# Patient Record
Sex: Female | Born: 1950 | Race: White | Hispanic: No | Smoking: Never smoker
Health system: Southern US, Community
[De-identification: ages and names within clinical notes are randomized; demographics above are authoritative.]

---

## 1998-12-03 ENCOUNTER — Other Ambulatory Visit: Admission: RE | Admit: 1998-12-03 | Discharge: 1998-12-03 | Payer: Self-pay | Admitting: Obstetrics and Gynecology

## 1999-02-05 ENCOUNTER — Other Ambulatory Visit: Admission: RE | Admit: 1999-02-05 | Discharge: 1999-02-05 | Payer: Self-pay | Admitting: Obstetrics & Gynecology

## 1999-05-11 ENCOUNTER — Other Ambulatory Visit: Admission: RE | Admit: 1999-05-11 | Discharge: 1999-05-11 | Payer: Self-pay | Admitting: Obstetrics and Gynecology

## 1999-12-21 ENCOUNTER — Other Ambulatory Visit: Admission: RE | Admit: 1999-12-21 | Discharge: 1999-12-21 | Payer: Self-pay | Admitting: *Deleted

## 2000-08-18 ENCOUNTER — Other Ambulatory Visit: Admission: RE | Admit: 2000-08-18 | Discharge: 2000-08-18 | Payer: Self-pay | Admitting: *Deleted

## 2001-01-30 ENCOUNTER — Other Ambulatory Visit: Admission: RE | Admit: 2001-01-30 | Discharge: 2001-01-30 | Payer: Self-pay | Admitting: *Deleted

## 2002-02-12 ENCOUNTER — Other Ambulatory Visit: Admission: RE | Admit: 2002-02-12 | Discharge: 2002-02-12 | Payer: Self-pay | Admitting: Obstetrics and Gynecology

## 2003-03-11 ENCOUNTER — Other Ambulatory Visit: Admission: RE | Admit: 2003-03-11 | Discharge: 2003-03-11 | Payer: Self-pay | Admitting: Obstetrics and Gynecology

## 2004-03-16 ENCOUNTER — Other Ambulatory Visit: Admission: RE | Admit: 2004-03-16 | Discharge: 2004-03-16 | Payer: Self-pay | Admitting: Obstetrics and Gynecology

## 2008-08-01 ENCOUNTER — Ambulatory Visit: Payer: Self-pay | Admitting: Sports Medicine

## 2008-08-01 DIAGNOSIS — M1612 Unilateral primary osteoarthritis, left hip: Secondary | ICD-10-CM | POA: Insufficient documentation

## 2008-12-11 ENCOUNTER — Encounter: Admission: RE | Admit: 2008-12-11 | Discharge: 2008-12-11 | Payer: Self-pay | Admitting: *Deleted

## 2008-12-11 ENCOUNTER — Ambulatory Visit: Payer: Self-pay | Admitting: Sports Medicine

## 2008-12-11 DIAGNOSIS — M25559 Pain in unspecified hip: Secondary | ICD-10-CM | POA: Insufficient documentation

## 2009-02-26 ENCOUNTER — Ambulatory Visit: Payer: Self-pay | Admitting: Sports Medicine

## 2009-06-01 ENCOUNTER — Encounter: Admission: RE | Admit: 2009-06-01 | Discharge: 2009-07-02 | Payer: Self-pay | Admitting: Sports Medicine

## 2012-02-07 ENCOUNTER — Other Ambulatory Visit (HOSPITAL_COMMUNITY)
Admission: RE | Admit: 2012-02-07 | Discharge: 2012-02-07 | Disposition: A | Discharge status: Home / Self Care | Payer: BC Managed Care – PPO | Source: Ambulatory Visit | Attending: Internal Medicine | Admitting: Internal Medicine

## 2012-02-07 DIAGNOSIS — Z1159 Encounter for screening for other viral diseases: Secondary | ICD-10-CM | POA: Insufficient documentation

## 2012-02-07 DIAGNOSIS — Z01419 Encounter for gynecological examination (general) (routine) without abnormal findings: Secondary | ICD-10-CM | POA: Insufficient documentation

## 2012-09-21 ENCOUNTER — Ambulatory Visit (INDEPENDENT_AMBULATORY_CARE_PROVIDER_SITE_OTHER): Payer: BC Managed Care – PPO

## 2012-09-21 DIAGNOSIS — Z23 Encounter for immunization: Secondary | ICD-10-CM

## 2013-09-11 ENCOUNTER — Ambulatory Visit (INDEPENDENT_AMBULATORY_CARE_PROVIDER_SITE_OTHER): Payer: BC Managed Care – PPO | Admitting: Family Medicine

## 2013-09-11 VITALS — BP 98/62 | HR 56 | Temp 98.0°F | Resp 18

## 2013-09-11 DIAGNOSIS — Z23 Encounter for immunization: Secondary | ICD-10-CM

## 2013-09-11 NOTE — Progress Notes (Signed)
  Subjective:    Patient ID: Caitlin Hood, female    DOB: [birth year], 62 y.o.   MRN: [id number]  HPI    Review of Systems     Objective:   Physical Exam        Assessment & Plan:

## 2014-09-17 ENCOUNTER — Ambulatory Visit (INDEPENDENT_AMBULATORY_CARE_PROVIDER_SITE_OTHER): Payer: BC Managed Care – PPO | Admitting: *Deleted

## 2014-09-17 DIAGNOSIS — Z23 Encounter for immunization: Secondary | ICD-10-CM

## 2015-04-09 ENCOUNTER — Other Ambulatory Visit (HOSPITAL_COMMUNITY)
Admission: RE | Admit: 2015-04-09 | Discharge: 2015-04-09 | Disposition: A | Discharge status: Home / Self Care | Payer: BLUE CROSS/BLUE SHIELD | Source: Ambulatory Visit | Attending: Internal Medicine | Admitting: Internal Medicine

## 2015-04-09 ENCOUNTER — Other Ambulatory Visit: Payer: Self-pay | Admitting: Internal Medicine

## 2015-04-09 DIAGNOSIS — Z01419 Encounter for gynecological examination (general) (routine) without abnormal findings: Secondary | ICD-10-CM | POA: Diagnosis not present

## 2015-04-13 LAB — CYTOLOGY - PAP

## 2015-09-11 ENCOUNTER — Ambulatory Visit (INDEPENDENT_AMBULATORY_CARE_PROVIDER_SITE_OTHER): Payer: BLUE CROSS/BLUE SHIELD | Admitting: Radiology

## 2015-09-11 DIAGNOSIS — Z23 Encounter for immunization: Secondary | ICD-10-CM

## 2016-02-16 ENCOUNTER — Encounter: Payer: Self-pay | Admitting: Sports Medicine

## 2016-02-16 ENCOUNTER — Ambulatory Visit (INDEPENDENT_AMBULATORY_CARE_PROVIDER_SITE_OTHER): Payer: BLUE CROSS/BLUE SHIELD | Admitting: Sports Medicine

## 2016-02-16 VITALS — BP 100/59 | HR 62 | Ht 68.0 in | Wt 135.0 lb

## 2016-02-16 DIAGNOSIS — R269 Unspecified abnormalities of gait and mobility: Secondary | ICD-10-CM | POA: Insufficient documentation

## 2016-02-16 DIAGNOSIS — M7741 Metatarsalgia, right foot: Secondary | ICD-10-CM | POA: Insufficient documentation

## 2016-02-16 DIAGNOSIS — M1990 Unspecified osteoarthritis, unspecified site: Secondary | ICD-10-CM | POA: Diagnosis not present

## 2016-02-16 DIAGNOSIS — M1611 Unilateral primary osteoarthritis, right hip: Secondary | ICD-10-CM

## 2016-02-16 NOTE — Progress Notes (Signed)
Patient ID: Caitlin Hood, female   DOB: [birth year], 65 y.o.   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  Past HX Hypermobility since childhood Daughter has Carylon Perches Danlos  ROS No sciatica No foot or ankle swelling No neuroma sxs  Pexam This w F/ NAD BP 100/59 mmHg  Pulse 62  Ht  (1.727 m)  Wt 135 lb (61.236 kg)  BMI 20.53 kg/m2  Slt TTP over distal RT forefoot plantar Moderate loss of transverse arch Mild loss of long arch Mild hallux limitus  LT foot Lateral deviation of 1st MTP (valgus) Hallux rigidus of great toe Collapse of transverse arch Mild loss long arch  Hips excellent ROM bilat Feels some pain on RT Other joints with inc mobility  Gait;  Walks with less rotation of RT hemi-pelvis Lying RT Misty Stanley shifted forward Trendelenburg to RT Uneven cadence

## 2016-02-16 NOTE — Assessment & Plan Note (Signed)
MT pads added to insoles bilat Scaphoid pads added  These felt comfortable  Test x 1 mo

## 2016-02-16 NOTE — Assessment & Plan Note (Signed)
She can delay THR uf we can control pain  Switch to custom orthotics if not enough response to insoles

## 2016-02-16 NOTE — Assessment & Plan Note (Signed)
Gait is improved after insoles with padding  Work on even pelvic rotation

## 2016-10-18 ENCOUNTER — Ambulatory Visit (INDEPENDENT_AMBULATORY_CARE_PROVIDER_SITE_OTHER): Payer: BLUE CROSS/BLUE SHIELD | Admitting: Sports Medicine

## 2016-10-18 ENCOUNTER — Encounter: Payer: Self-pay | Admitting: Sports Medicine

## 2016-10-18 ENCOUNTER — Ambulatory Visit: Payer: Self-pay

## 2016-10-18 VITALS — BP 100/60 | Ht 68.0 in | Wt 130.0 lb

## 2016-10-18 DIAGNOSIS — M25579 Pain in unspecified ankle and joints of unspecified foot: Secondary | ICD-10-CM

## 2016-10-18 DIAGNOSIS — G8929 Other chronic pain: Secondary | ICD-10-CM

## 2016-10-18 DIAGNOSIS — M1611 Unilateral primary osteoarthritis, right hip: Secondary | ICD-10-CM | POA: Diagnosis not present

## 2016-10-18 DIAGNOSIS — M25562 Pain in left knee: Principal | ICD-10-CM

## 2016-10-18 DIAGNOSIS — M1612 Unilateral primary osteoarthritis, left hip: Secondary | ICD-10-CM | POA: Diagnosis not present
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
## 2016-10-18 NOTE — Assessment & Plan Note (Signed)
Now S/P THR Motion is very good  Work on hip abductio strength as this may affect knee

## 2016-10-18 NOTE — Assessment & Plan Note (Signed)
Motion is worse and she may need THR in future

## 2016-10-18 NOTE — Assessment & Plan Note (Signed)
Needs good arch support but also her hallux limitus may be affecting swelling of flex hallucis and putting presure on tibial nerve  Temp insoles Add first ray posts Add scaphoid padding  After 1 month see response  May need custom orthotics before next long hike

## 2017-05-16 ENCOUNTER — Ambulatory Visit (INDEPENDENT_AMBULATORY_CARE_PROVIDER_SITE_OTHER): Payer: Medicare Other | Admitting: Sports Medicine

## 2017-05-16 ENCOUNTER — Encounter: Payer: Self-pay | Admitting: Sports Medicine

## 2017-05-16 VITALS — BP 103/50 | Ht 68.0 in | Wt 131.0 lb

## 2017-05-16 DIAGNOSIS — R269 Unspecified abnormalities of gait and mobility: Secondary | ICD-10-CM

## 2017-05-16 DIAGNOSIS — M1611 Unilateral primary osteoarthritis, right hip: Secondary | ICD-10-CM

## 2017-05-16 NOTE — Progress Notes (Signed)
Subjective:     Patient ID: Caitlin Hood, female   DOB: 1951/08/27, 66 y.o.   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 She has hallux limitus of both great toes and associated toe pain, especially after hiking. She also has history of arthritis of both hips, with a hip replacement on the left. She reports hip pain on the R that radiates into her R groin and down right thigh. She would like an Xray of her R hip and indicates that she is considering a hip replacement on that side. Patient was having knee pain until corrected with temporary orthotics 10/24. She received temp insoles with first ray posts and scaphoid padding at that visit. She reports they feel much better with walking and give her toes the cushion she needs for pain relief. She is here today for custom orthotic inserts.  Planning an extended hike in [place]  Review of Systems + pain with walking + hip pain on R - gait disturbance - falls     Objective:   Physical Exam BP (!) 103/50   Ht 5\' 8"  (1.727 m)   Wt 131 lb (59.4 kg)   BMI 19.92 kg/m  General: Well-appearing female, sitting comfortably on exam table. A&O x3. NAD. Extremities: Warm and well-perfused. Cap refill < 3 sec. No swelling or erythema of feet. MSK:  Foot inspection and palpation reveals some breakdown of the transverse arch, widening of the forefoot, and a drop of MT heads bilaterally and L > R.  She has deformity of the 1st MTP on L foot. Good longitudinal arches. Stance without valgus deviation. No abnormal callouses present. Bilateral 4th and 5th metatarsals with subluxation and rotation.  She has 20 degrees of flexion and 15 degrees of extension in L great toe without pain. Right great toe with 20degrees flexion and 20 degrees extension.  MTP RT shows moderate changes of DJD as well    R Hip: ROM Seated RT 45 deg ER and 25 deg IR/ Left (THR) 45 deg ER and 30 deg IR Full flexion on left and 5 deg less on  RT Strength in abduction 5/5 on L, 4/5 on R Pelvic alignment unremarkable to inspection. Standing hip rotation and gait without trendelenburg / unsteadiness. Neuro: Lower extremity sensation grossly intact without observed focal deficits.  Patient was fitted for a semi-rigid orthotic. The orthotic was heated and afterward the patient stood on the orthotic blank positioned on the orthotic stand. The patient was positioned in subtalar neutral position and 10 degrees of ankle dorsiflexion in a weight bearing stance. After completion of molding, a stable base was applied to the orthotic blank. The blank was ground to a stable position for weight bearing. Size: 9 REd EVA Base: EVA blue med density Posting: First ray on both Additional orthotic padding: none  Post orthotic gait shows nice correction with neutral foot strike     Assessment:     Patient is 66 year old female who plans another long hike in [place] soon. She found benefit in her prior temporary insoles 10/18/16 and is a good candidate for custom orthotic inserts today. It appears she is avoiding pressure on her great toes due to MTP joint arthritis, striking on lateral edge and causing the subluxation of her 4th and 5th metatarsals bilaterally. Arch supports were built up for longitudinal support on medial side of shoes. First ray posts were added bilaterally for comfort.    Plan:  Hallux limitus, bilateral: - New custom orthotic inserts today, described above - Follow-up as needed   I personally was present and performed or re-performed the history, physical exam and medical decision-making activities of this service and have verified that the service and findings are accurately documented in the student's note. Enid BaasKarl Fields, MD

## 2017-05-16 NOTE — Assessment & Plan Note (Signed)
Custom orthotics made today  I spent 45 minutes with this patient. Over 50% of visit was spend in counseling and coordination of care for problems with foot breakdown and hip OA.  Caitlin BaasKarl Shanecia Hoganson, MD

## 2017-05-16 NOTE — Assessment & Plan Note (Signed)
Orthotic correction may helo with chronic pain  No trendelenburg  Cont strength work

## 2017-05-17 ENCOUNTER — Ambulatory Visit
Admission: RE | Admit: 2017-05-17 | Discharge: 2017-05-17 | Disposition: A | Discharge status: Home / Self Care | Payer: Medicare Other | Source: Ambulatory Visit | Attending: Sports Medicine | Admitting: Sports Medicine

## 2017-05-17 DIAGNOSIS — M1611 Unilateral primary osteoarthritis, right hip: Secondary | ICD-10-CM

## 2017-09-25 ENCOUNTER — Ambulatory Visit: Payer: Self-pay

## 2017-09-25 ENCOUNTER — Ambulatory Visit (INDEPENDENT_AMBULATORY_CARE_PROVIDER_SITE_OTHER): Payer: Medicare Other | Admitting: Sports Medicine

## 2017-09-25 VITALS — BP 96/54 | Ht 67.5 in | Wt 130.0 lb

## 2017-09-25 DIAGNOSIS — M1712 Unilateral primary osteoarthritis, left knee: Secondary | ICD-10-CM | POA: Insufficient documentation

## 2017-09-25 DIAGNOSIS — M25562 Pain in left knee: Secondary | ICD-10-CM

## 2017-09-25 NOTE — Assessment & Plan Note (Signed)
We will use HEP to keep stable  Avoid difficult positions in yoga  Knee compression for effusion  Trial in Knee OA study  Reck 2 mos prn

## 2017-09-25 NOTE — Progress Notes (Signed)
   Subjective:    Patient ID: Caitlin Hood, female    DOB: [birth year], 66 y.o.   MRN: [id number]  HPI 66 yo Caitlin Hood presents today to discuss her left medial knee pain. She states that the pain started while she was on a 100 mile walking pilgrimage in [place]. She reports dull achy pain in her medial knee that is worse at night. The pain resolves some with walking, but tends to swell some. She admits to the feeling of instability with prolonged walking. She has tried ibuprofen, 2 tablets at bedtime, for pain/inflammartion with little relief. She is here today seeking treatment advice so that she can continue her yoga and pilates.    Review of Systems Musculoskeletal: Admits to left knee pain, swelling, instability, Denies rashes, bruising, lesions Neurovascular: Denies numbness, tingling or pallor    Objective:   Physical Exam  Thin F in NAD BP (!) 96/54   Ht 5' 7.5" (1.715 m)   Wt 130 lb (59 kg)   BMI 20.06 kg/m   Inspection: Suprapatellar swelling when compared to the right Palpation: very mild tenderness over medial joint, non-tender over lateral joint line, MCL, LCL, suprapatellar bursa, quad tendon, pes anserine bursa or patellar tendon ROM: limited knee extension on the left while supine about 3 deg, Flexion to 150 deg vs. To buttocks on RT great internal rotation, external rotation at the hip  Strength: 5/5 adductor, abductor, hip flexion, knee extension and knee flexion Special tests: patellar grind with passive lateral motion L> R, no valgus/varus laxity, neg bounce test, McMurray, Lachman, and Drawer  Neurovascular: pulses intact throughout, sensation normal bilaterally  Ultrasound of Knee- left  Patellar tendon - Normal Quad tendon - Normal Suprapatellar pouch effusion- moderate extending 7 cm into thigh/ also on tranasverse spans  X knee  No SPP swelling on the left Medial meniscus- mild/mod thinning/ mild swelling Lateral meniscus- normal Mild joint line  spurring noted medially Troch groove with med patellar spur/ preservxed cartilage  Large medial bakers cyst  Summary and additional findings- findings left knee consistent with mild OA and mild degenerative medial meniscus large Bakers cyst  Ultrasound and interpretation by Sibyl Parr. Larin Weissberg, MD     Assessment & Plan:  Left knee pain/effusion Likely secondary to degenerative changes in the knee with some medial meniscal involvement. Pt is hypermobile in most of her joints   She was also advised to use a cushion when in child's pose, in an effort take stress off the medial meniscus. Pt was educated/instructed on quadriceps strengthening exercises to perform at home. She may continue knee compression for comfort with walking.  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 We will continue to monitor for pain/changes.

## 2017-11-14 ENCOUNTER — Ambulatory Visit: Payer: Medicare Other | Admitting: Sports Medicine

## 2017-12-14 IMAGING — DX DG HIP (WITH OR WITHOUT PELVIS) 2-3V*R*
2 series · 2 of 2 positions shown · non-contrast
Comparison: None.

CLINICAL DATA: Chronic right hip pain for several years, no known
injury, initial encounter

EXAM:
DG HIP (WITH OR WITHOUT PELVIS) 2V RIGHT

[dg hip unilat w or w/o pelvis 2-3 views  (1 of 2)]
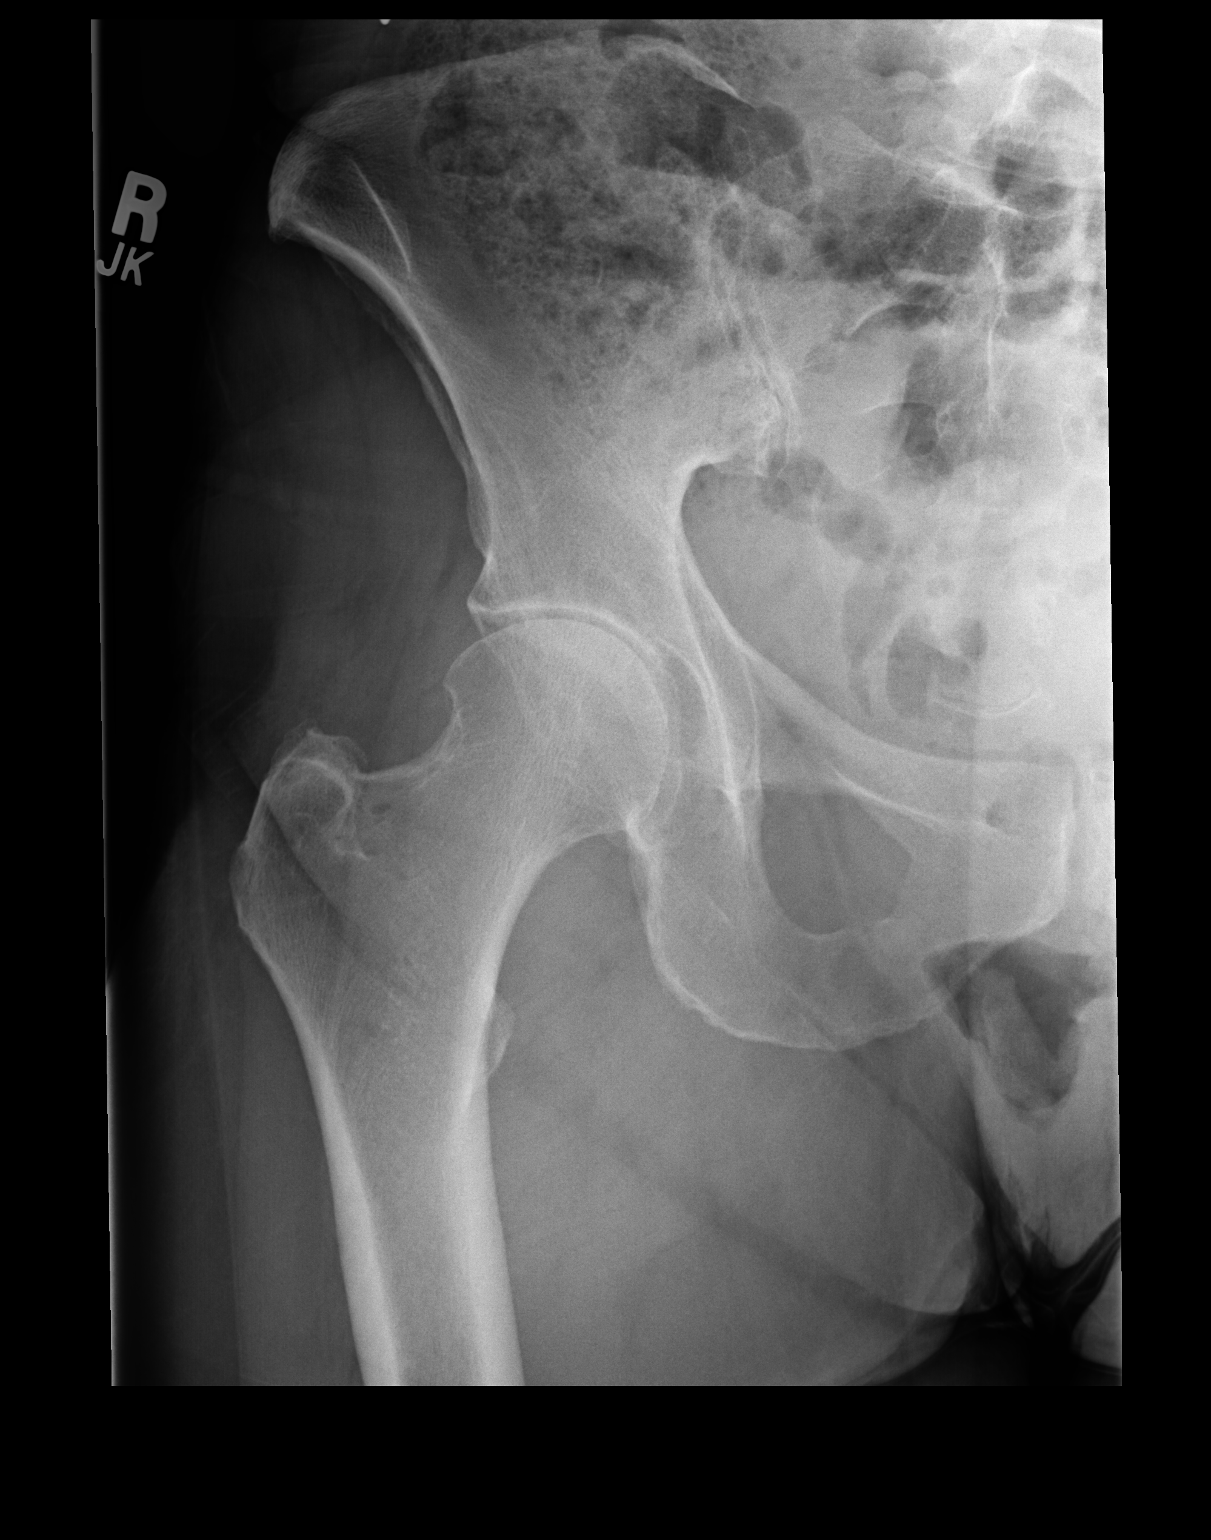

[dg hip unilat w or w/o pelvis 2-3 views  (2 of 2)]
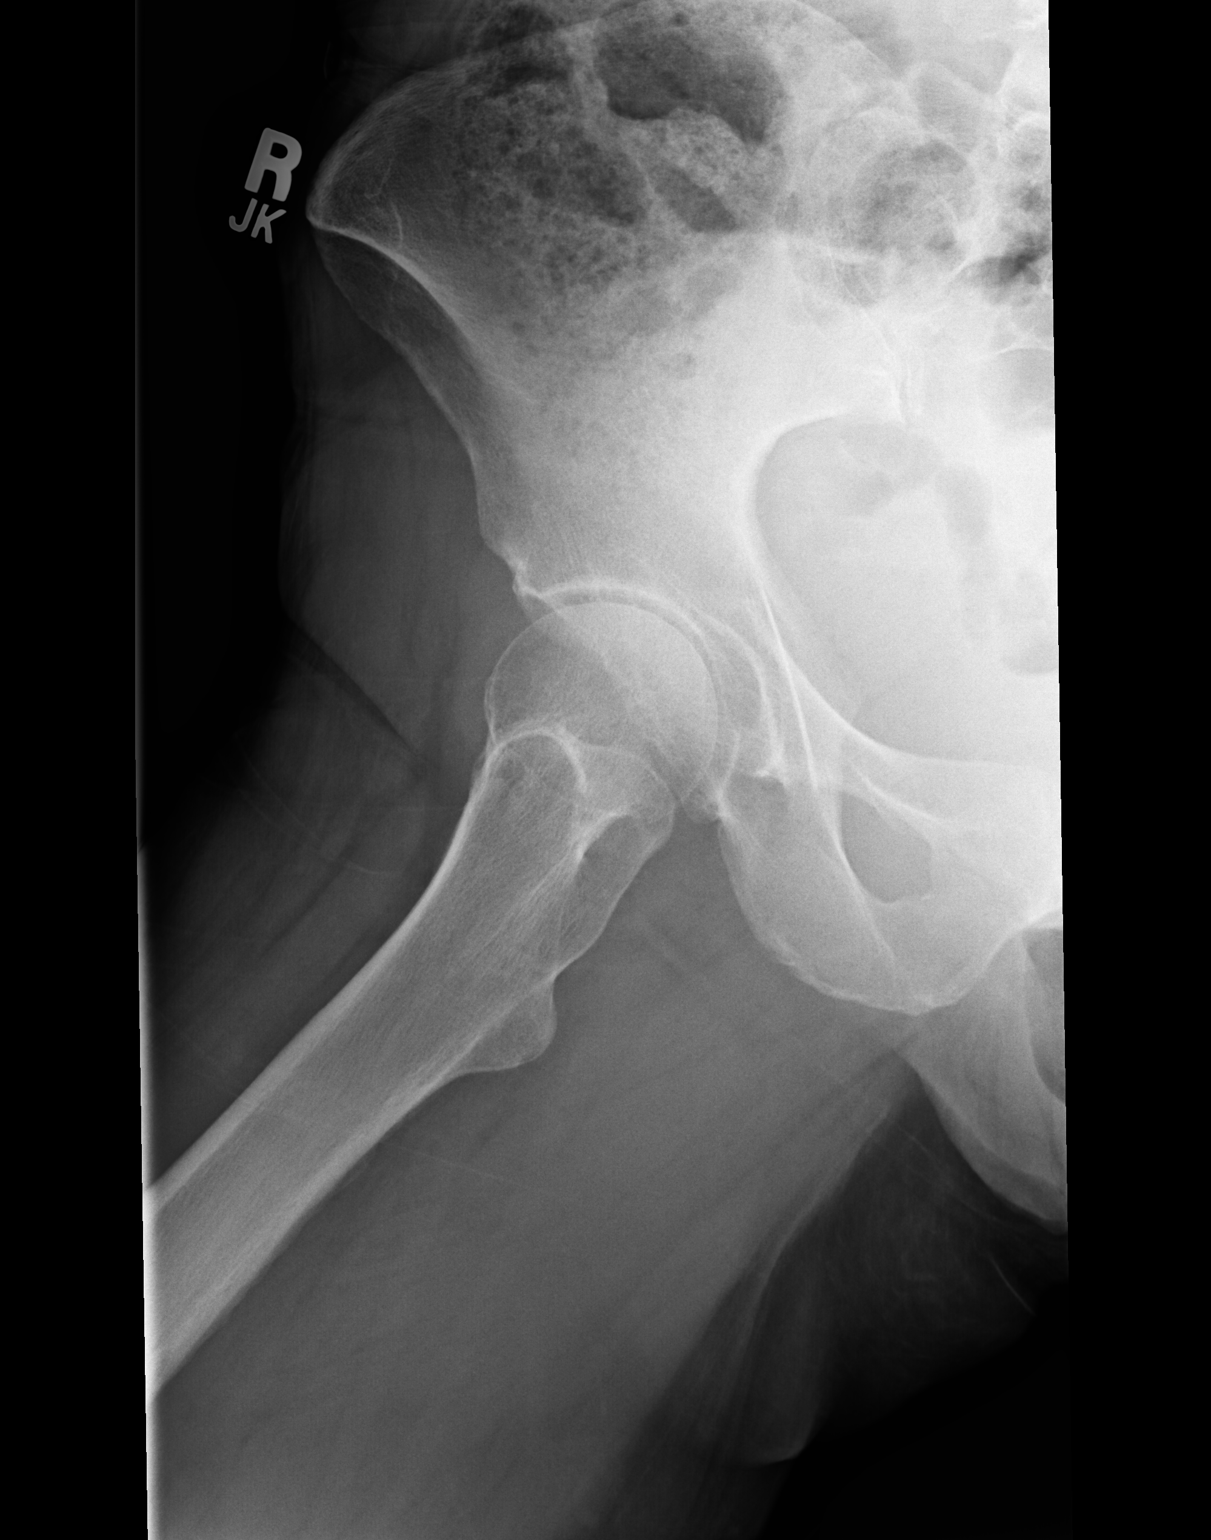

[2 of 2 positions shown; findings below may reference images not displayed]

FINDINGS: Mild degenerative changes of the hip joint are seen. No acute
fracture or dislocation is noted. The visualized portions of the
pelvic ring are intact. No soft tissue abnormality is seen.
IMPRESSION: Mild degenerative change without acute abnormality.

## 2018-07-02 ENCOUNTER — Ambulatory Visit (INDEPENDENT_AMBULATORY_CARE_PROVIDER_SITE_OTHER): Payer: Medicare Other | Admitting: Sports Medicine

## 2018-07-02 VITALS — BP 100/66 | Ht 68.0 in | Wt 130.0 lb

## 2018-07-02 DIAGNOSIS — M2022 Hallux rigidus, left foot: Secondary | ICD-10-CM

## 2018-07-02 DIAGNOSIS — M2021 Hallux rigidus, right foot: Secondary | ICD-10-CM

## 2018-07-02 NOTE — Progress Notes (Signed)
   Subjective:    Patient ID: Caitlin Hood, female    DOB: [birth year], 67 y.o.   MRN: [id number]  HPI chief complaint: "I need new orthotics"  Pleasant 67 year old female comes in today requesting a new custom orthotics. Current orthotics are about a-year-old. They have a first ray post on them for hallux rigidus. The posts are starting to come off. Her only concern today is a prominent varicose vein on the top of her right foot. It is nonpainful. It is been present for quite some time.  Interim medical history reviewed Medications reviewed Allergies reviewed   Review of Systems As above    Objective:   Physical Exam  Well-developed, well-nourished. No acute distress. Awake alert and oriented 3. Vital signs reviewed  Examination of her feet in the standing position shows a fairly well preserved arch. Hallux rigidus bilaterally. Several small varicose veins on both feet with a prominent easily compressible vein on the dorsum of the right foot. No erythema. No tenderness to palpation.      Assessment & Plan:   Bilateral hallux rigidus Varicose vein  New custom orthotics were created today. Gait was observed to be neutral with orthotics in place. She found them to be comfortable prior to leaving the office. Total of 30 minutes was spent with the patient with greater than 50% of the time spent in face-to-face consultation discussing orthotic construction, instruction, and fitting. Reassurance regarding her varicose vein on the dorsum of the right foot. No treatment necessary at this time. Patient will follow-up as needed.  Patient was fitted for a : standard, cushioned, semi-rigid orthotic. The orthotic was heated and afterward the patient stood on the orthotic blank positioned on the orthotic stand. The patient was positioned in subtalar neutral position and 10 degrees of ankle dorsiflexion in a weight bearing stance. After completion of molding, a stable base was applied to the  orthotic blank. The blank was ground to a stable position for weight bearing. Size: 9 Base: Blue EVA Posting: B/L first ray posts Additional orthotic padding: none

## 2018-10-22 ENCOUNTER — Ambulatory Visit: Payer: Medicare Other | Admitting: Sports Medicine

## 2018-10-29 ENCOUNTER — Ambulatory Visit
Admission: RE | Admit: 2018-10-29 | Discharge: 2018-10-29 | Disposition: A | Discharge status: Home / Self Care | Payer: Medicare Other | Source: Ambulatory Visit | Attending: Sports Medicine | Admitting: Sports Medicine

## 2018-10-29 ENCOUNTER — Ambulatory Visit: Payer: Medicare Other | Admitting: Sports Medicine

## 2018-10-29 VITALS — BP 108/72 | Ht 68.0 in | Wt 130.0 lb

## 2018-10-29 DIAGNOSIS — M25562 Pain in left knee: Secondary | ICD-10-CM | POA: Diagnosis not present

## 2018-10-29 NOTE — Progress Notes (Signed)
   Subjective:    Patient ID: Caitlin Hood, female    DOB: 1951-12-09, 67 y.o.   MRN: [id number]  HPI chief complaint: Left knee pain  Caitlin Hood comes in today complaining of left knee pain.  Localizes her pain primarily to the medial aspect of her knee.  She is very active and enjoys walking.  Pain is most noticeable after exercise.  Her husband has noticed that she is limping.  She does endorse swelling in the posterior aspect of her knee.  She has a history of Baker's cyst here.  She has been wearing a compression sleeve and as a result the size of the cyst has decreased dramatically over the past couple of weeks.  She denies swelling elsewhere in the knee.  No recent x-rays.  No locking or catching in the knee.  She does endorse some occasional popping.  Interim medical history reviewed Medications reviewed Allergies reviewed   Review of Systems    As above Objective:   Physical Exam  Well-developed, well-nourished.  No acute distress.  Awake alert and oriented x3.  Vital signs reviewed  Left knee: Full range of motion.  No obvious effusion.  1+ patellofemoral crepitus.  She is tender to palpation along the medial joint line but a negative Thessaly's.  No tenderness along the lateral joint line.  Knee is grossly stable ligamentous exam.  There is some fullness in the popliteal fossa consistent with a Baker's cyst.  No tenderness to palpation here.  Neurovascularly intact distally.  Walking with a slight limp.  MSK ultrasound of the left knee was performed.  Limited images were obtained.  There is a small joint effusion as well as an obvious Baker's cyst.  Spurring along the medial joint line with some degenerative changes in the medial meniscus.  There is also some fluid extruding from the medial joint space around the medial meniscus.  Findings consistent with medial compartmental OA and Baker's cyst.      Assessment & Plan:  Left knee pain and swelling likely secondary to medial  compartmental DJD Baker's cyst, left knee  Although she does have a Baker's cyst on ultrasound, it is decreasing in size with compression.  I think most of her symptoms are originating from her medial compartmental OA and not the Baker's cyst itself.  I would like to start with getting some x-rays of her left knee specifically to evaluate the degree of medial compartmental DJD present.  She may continue with her compression sleeve wearing it when active.  She will also start isometric quad exercises daily.  Phone follow-up with the x-ray results when available.  I did discuss merits of cortisone injection if symptoms warrant but the patient declines today.  Addendum: X-rays reviewed.  Medial compartment is fairly well-preserved.  She has marked degenerative changes at the patellofemoral joint.

## 2019-03-15 ENCOUNTER — Telehealth (INDEPENDENT_AMBULATORY_CARE_PROVIDER_SITE_OTHER): Payer: Self-pay

## 2019-03-15 NOTE — Telephone Encounter (Signed)
Called patient no answer LMOM to return  Our call. If she calls back please ask questions below.   Do you have now or have you had in the past 7 days a fever and/or chills?   Do you have now or have you had in the past 7 days a cough?   Do you have now or have you had in the last 7 days nausea, vomiting or abdominal pain?   Have you been exposed to anyone who has tested positive for COVID-19?   Have you or anyone who lives with you traveled within the last month?

## 2019-03-18 ENCOUNTER — Other Ambulatory Visit: Payer: Self-pay

## 2019-03-18 ENCOUNTER — Ambulatory Visit (INDEPENDENT_AMBULATORY_CARE_PROVIDER_SITE_OTHER): Payer: Medicare Other

## 2019-03-18 ENCOUNTER — Encounter (INDEPENDENT_AMBULATORY_CARE_PROVIDER_SITE_OTHER): Payer: Self-pay | Admitting: Orthopaedic Surgery

## 2019-03-18 ENCOUNTER — Ambulatory Visit (INDEPENDENT_AMBULATORY_CARE_PROVIDER_SITE_OTHER): Payer: Medicare Other | Admitting: Orthopaedic Surgery

## 2019-03-18 DIAGNOSIS — M25551 Pain in right hip: Secondary | ICD-10-CM

## 2019-03-18 NOTE — Progress Notes (Signed)
Office Visit Note   Patient: Caitlin Hood           Date of Birth: 26-Jan-1951           MRN: [id number] Visit Date: 03/18/2019              Requested by: Marden Noble, MD [address] [place], [place] [address] PCP: Marden Noble, MD   Assessment & Plan: Visit Diagnoses:  1. Pain in right hip     Plan: I went over her x-rays with her in detail in terms of her x-ray findings comparing films from as far back as 2009 to today to assess the right hip.  Right now we are going to just treat this with observation.  If she continues to be symptomatic or worsens anyway I recommend an intra-articular steroid injection in the right hip.  We would even consider standing views lumbar spine.  All question concerns were answered and addressed.  Follow-up as otherwise as needed.  Follow-Up Instructions: Return if symptoms worsen or fail to improve.   Orders:  Orders Placed This Encounter  Procedures  . XR HIP UNILAT W OR W/O PELVIS 1V RIGHT   No orders of the defined types were placed in this encounter.     Procedures: No procedures performed   Clinical Data: No additional findings.   Subjective: Chief Complaint  Patient presents with  . Right Hip - Pain  The patient is a very pleasant and young appearing 68 year old female who is very athletic and physically fit.  She comes in with a chief complaint of right hip pain for several years now.  11 years ago she had her left hip replaced.  She says it feels about the same in terms of pain in the groin and when she says it feels about the same feels in terms of pain, the same that it felt when she had to have her left hip replaced.  She works on activity modification to try to calm things down.  She is active with yoga and Pilates.  HPI  Review of Systems She currently denies any headache, chest pain, short of breath, fever, chills, nausea, vomiting  Objective: Vital Signs: There were no vitals taken for this visit.   Physical Exam She is alert and orient x3 and in no acute distress Ortho Exam Examination of her right hip shows that she has full flexion extension as well as rotation internally and externally of the right hip with no blocks to rotation.  She has minimal pain on extremes of rotation which is in the groin.  There is no pain to palpation of the trochanteric area of the right hip or down the IT band.  She does get some slight pain in the issue area in the sciatic area on the right side.  She has this on the left side as well. Specialty Comments:  No specialty comments available.  Imaging: Xr Hip Unilat W Or W/o Pelvis 1v Right  Result Date: 03/18/2019 An AP pelvis and lateral of the right hip shows a total hip arthroplasty on the left side.  The right hip she has no acute findings.  There is slight superior lateral joint space narrowing when compared to previous films from 2009 and 2018.    PMFS History: Patient Active Problem List   Diagnosis Date Noted  . Osteoarthritis of left knee 09/25/2017  . Pain in joint, ankle and foot 10/18/2016  . Arthritis of right hip 02/16/2016  .  Abnormality of gait 02/16/2016  . Metatarsalgia of right foot 02/16/2016  . HIP PAIN, LEFT 12/11/2008  . Osteoarthritis of left hip 08/01/2008   History reviewed. No pertinent past medical history.  History reviewed. No pertinent family history.  History reviewed. No pertinent surgical history. Social History   Occupational History  . Not on file  Tobacco Use  . Smoking status: Never Smoker  . Smokeless tobacco: Never Used  Substance and Sexual Activity  . Alcohol use: Not on file  . Drug use: Not on file  . Sexual activity: Not on file

## 2019-05-28 IMAGING — DX DG KNEE AP/LAT W/ SUNRISE*L*
3 series · 3 of 3 positions shown · non-contrast
Comparison: Left tibia-fibula 10/05/2011.

CLINICAL DATA: 67-year-old female chronic left knee pain. No
injury. Initial encounter.

EXAM:
LEFT KNEE 3 VIEWS

[dg knee ap/lat w/ sunrise left (1 of 3)]
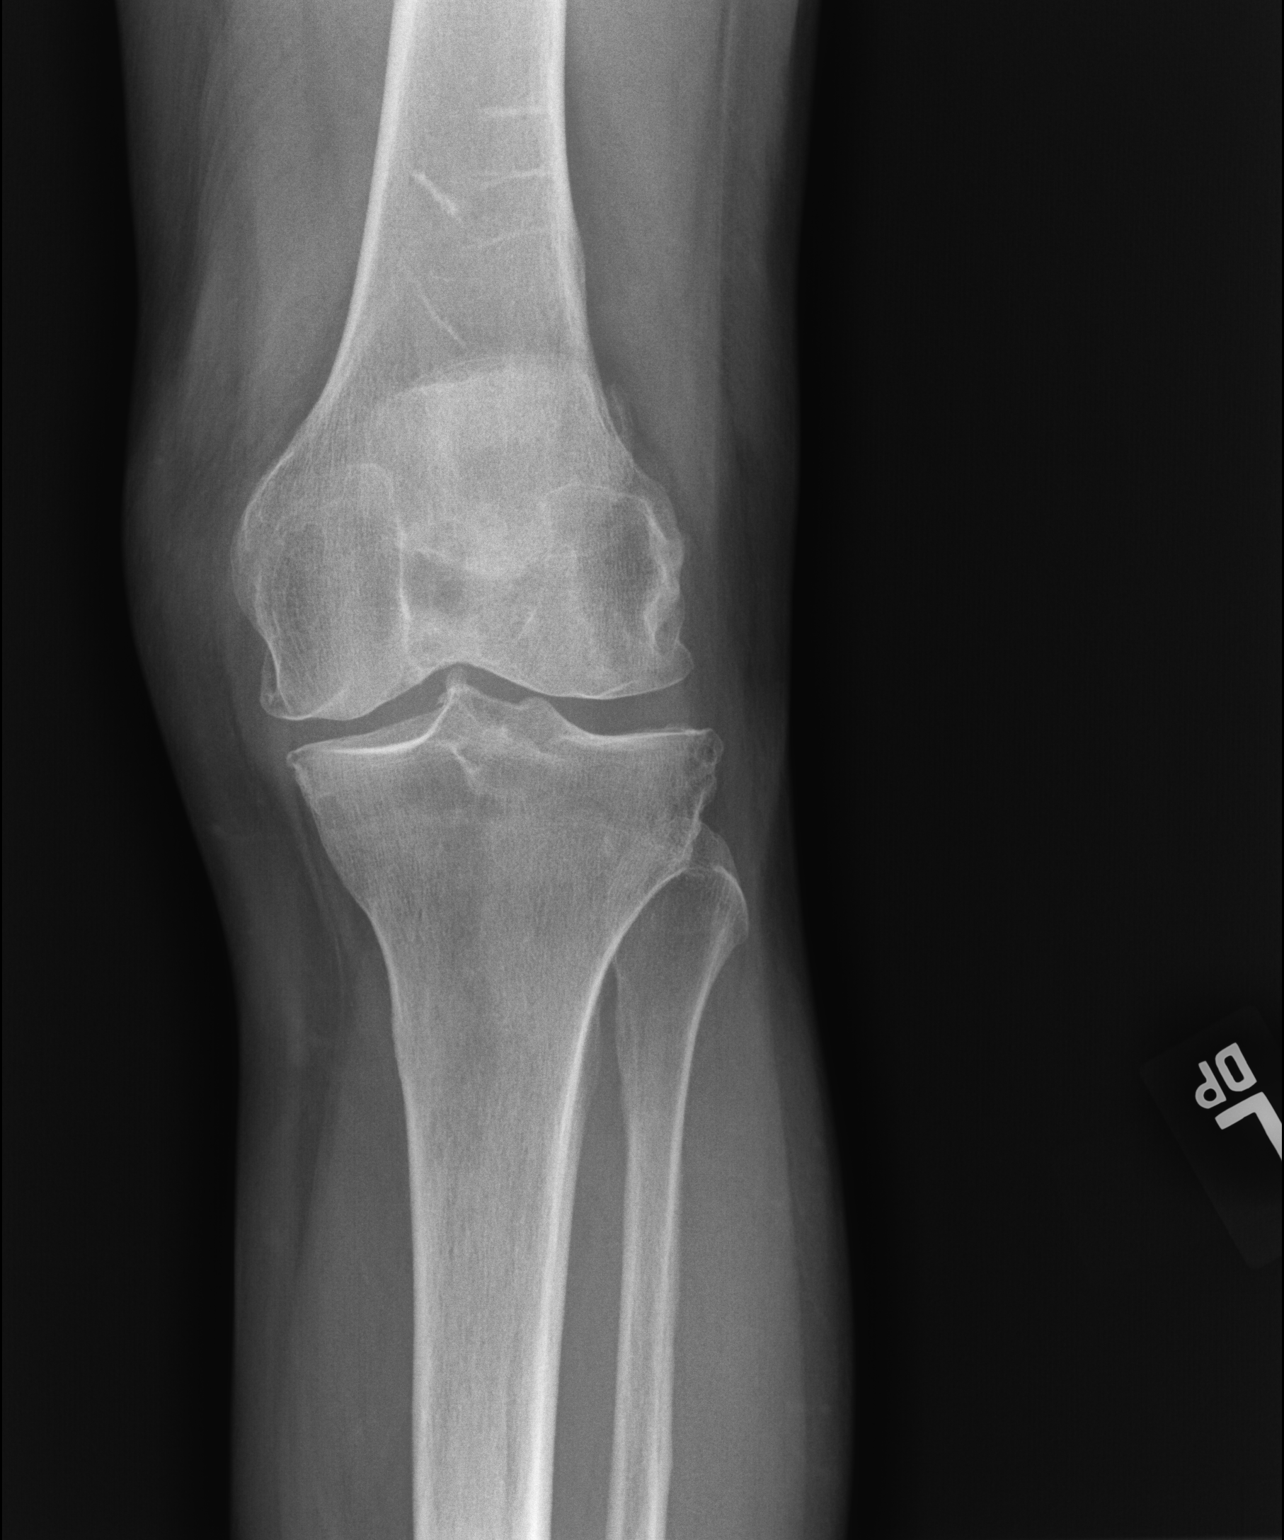

[dg knee ap/lat w/ sunrise left (2 of 3)]
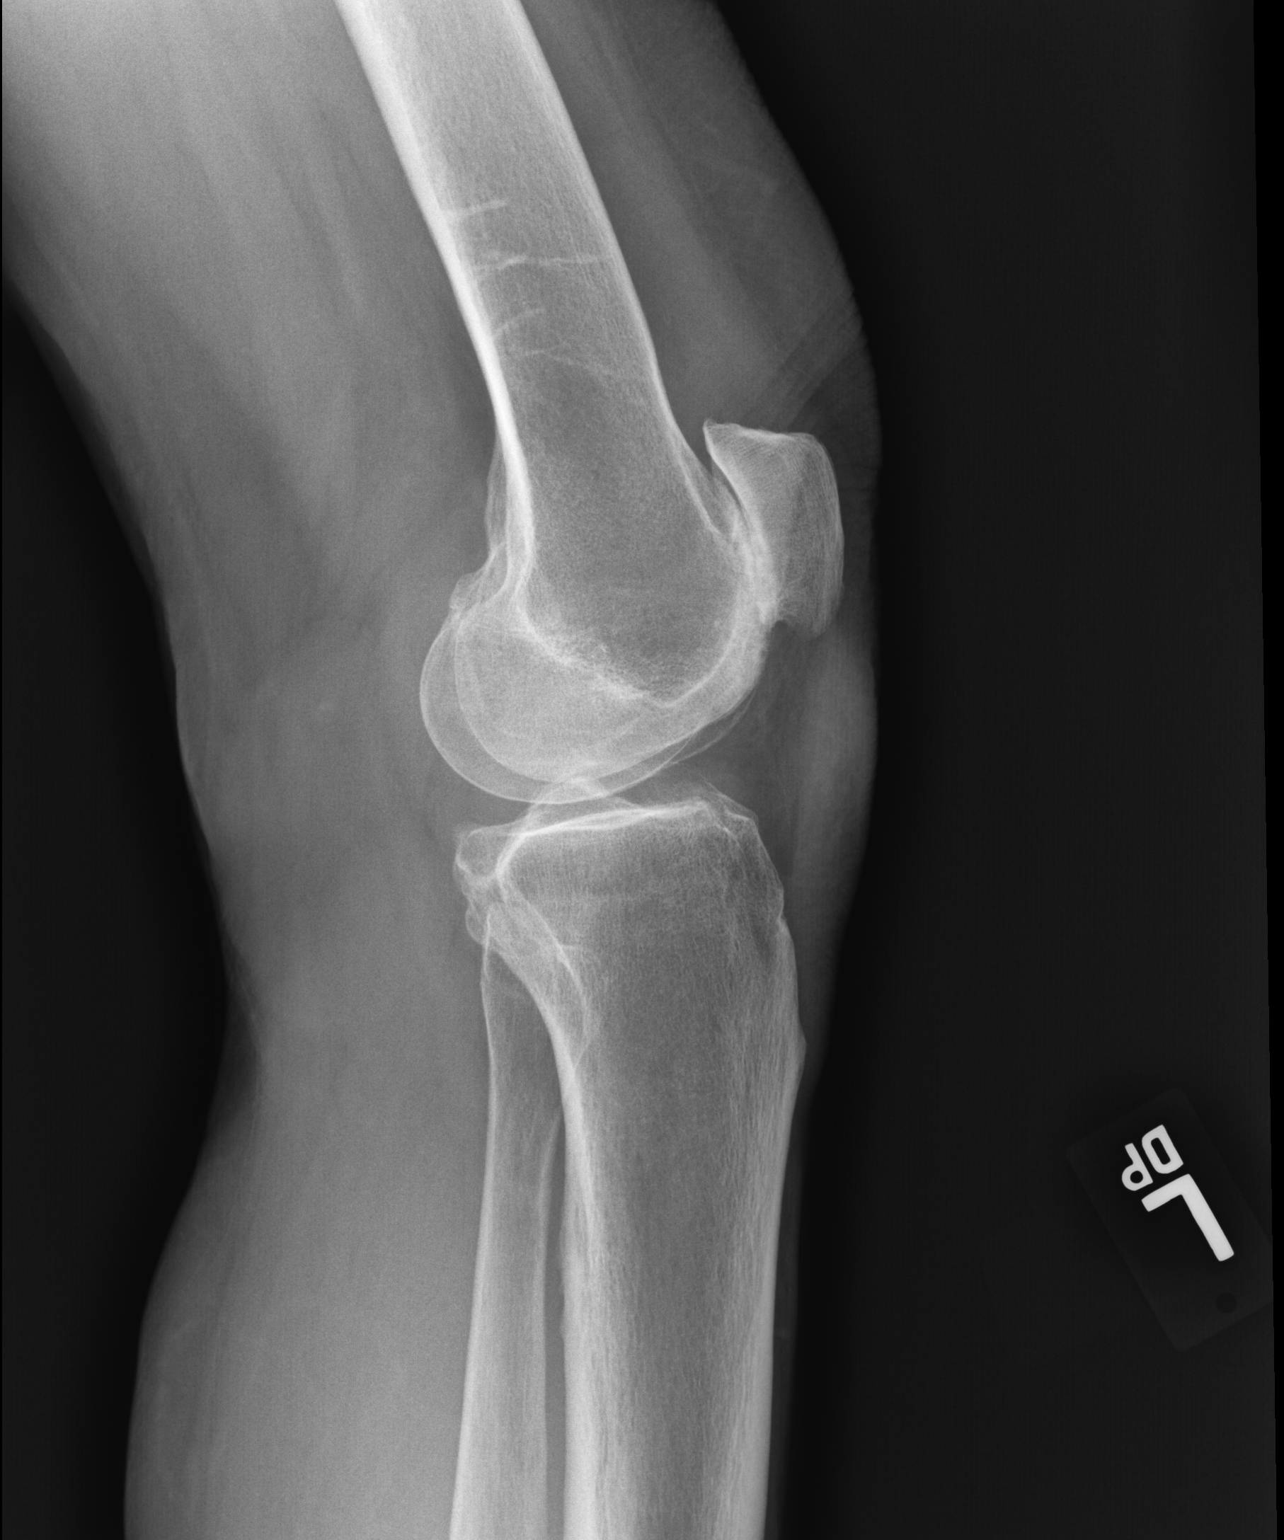

[dg knee ap/lat w/ sunrise left (3 of 3)]
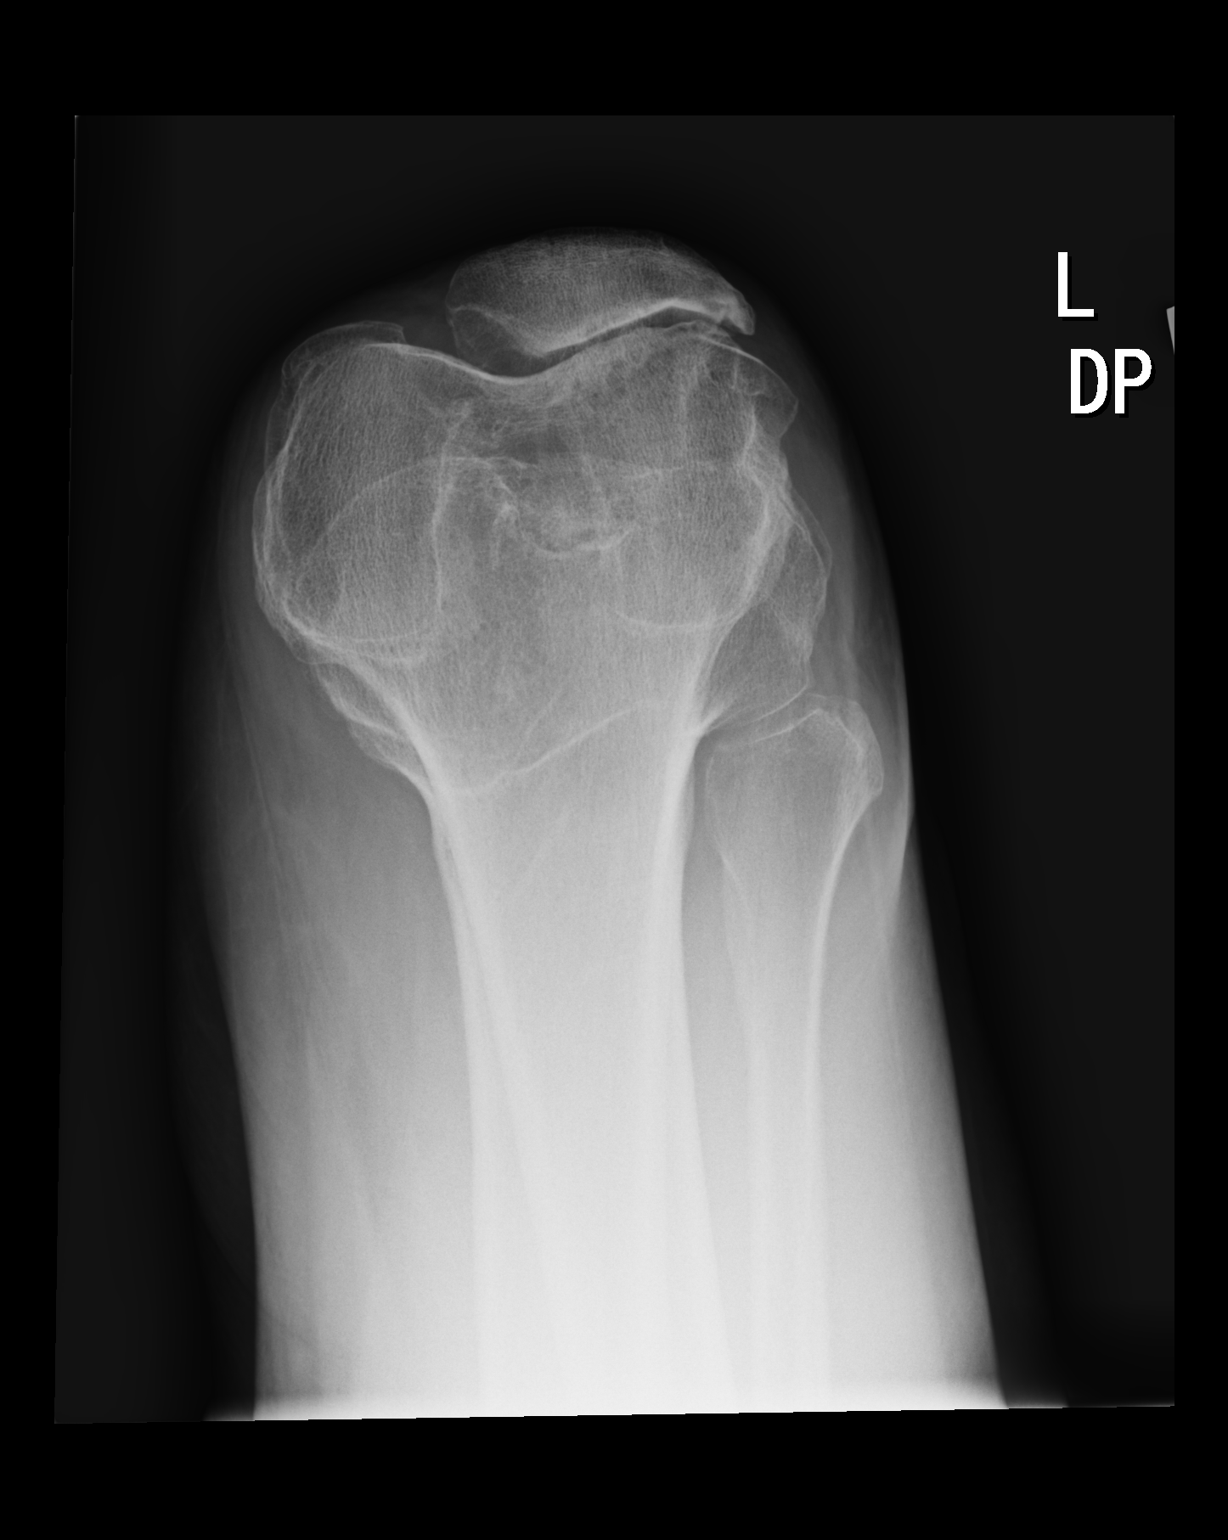

[3 of 3 positions shown; findings below may reference images not displayed]

FINDINGS: Marked patellofemoral joint degenerative changes.

Mild medial tibiofemoral joint space narrowing.

Questionable tiny suprapatellar joint effusion.

No fracture or dislocation. Nonspecific linear sclerosis distal
femur probably incidental.
IMPRESSION: 1. Marked patellofemoral joint degenerative changes.
2. Mild medial tibiofemoral joint space narrowing.

## 2020-03-17 ENCOUNTER — Other Ambulatory Visit: Payer: Self-pay

## 2020-03-17 ENCOUNTER — Ambulatory Visit: Payer: Medicare PPO | Admitting: Sports Medicine

## 2020-03-17 VITALS — BP 100/60 | Ht 67.0 in | Wt 130.0 lb

## 2020-03-17 DIAGNOSIS — M2022 Hallux rigidus, left foot: Secondary | ICD-10-CM

## 2020-03-17 DIAGNOSIS — M2021 Hallux rigidus, right foot: Secondary | ICD-10-CM | POA: Diagnosis not present

## 2020-03-18 ENCOUNTER — Encounter: Payer: Self-pay | Admitting: Sports Medicine

## 2020-03-18 NOTE — Progress Notes (Addendum)
Patient ID: Caitlin Hood, female   DOB: 1951-07-21, 69 y.o.   MRN: [id number]  Caitlin Hood comes in today for new custom orthotics.  Last orthotics were created on July 02, 2018.  She has a well-documented history of bilateral hallux rigidus and right hip osteoarthritis.  Both are stable at this point in time.  New custom orthotics were created for [place] today.  She found them to be comfortable prior to leaving the office.  Gait was neutral with orthotics in place.  She may continue with activity as tolerated and will follow up with me as needed.  Patient was fitted for a : standard, cushioned, semi-rigid orthotic. The orthotic was heated and afterward the patient stood on the orthotic blank positioned on the orthotic stand. The patient was positioned in subtalar neutral position and 10 degrees of ankle dorsiflexion in a weight bearing stance. After completion of molding, a stable base was applied to the orthotic blank. The blank was ground to a stable position for weight bearing. Size: 9 Base: Blue EVA Posting: B/L first ray posts Additional orthotic padding: none

## 2021-04-12 DIAGNOSIS — D485 Neoplasm of uncertain behavior of skin: Secondary | ICD-10-CM | POA: Diagnosis not present

## 2021-04-12 DIAGNOSIS — D225 Melanocytic nevi of trunk: Secondary | ICD-10-CM | POA: Diagnosis not present

## 2021-04-12 DIAGNOSIS — L308 Other specified dermatitis: Secondary | ICD-10-CM | POA: Diagnosis not present

## 2021-04-12 DIAGNOSIS — Z85828 Personal history of other malignant neoplasm of skin: Secondary | ICD-10-CM | POA: Diagnosis not present

## 2021-04-22 ENCOUNTER — Other Ambulatory Visit: Payer: Self-pay

## 2021-04-22 ENCOUNTER — Ambulatory Visit (INDEPENDENT_AMBULATORY_CARE_PROVIDER_SITE_OTHER): Payer: Medicare PPO | Admitting: Sports Medicine

## 2021-04-22 ENCOUNTER — Encounter: Payer: Medicare PPO | Admitting: Sports Medicine

## 2021-04-22 VITALS — BP 114/62 | Ht 67.0 in | Wt 135.0 lb

## 2021-04-22 DIAGNOSIS — M21619 Bunion of unspecified foot: Secondary | ICD-10-CM

## 2021-04-22 DIAGNOSIS — M2021 Hallux rigidus, right foot: Secondary | ICD-10-CM

## 2021-04-22 DIAGNOSIS — M2022 Hallux rigidus, left foot: Secondary | ICD-10-CM

## 2021-04-22 DIAGNOSIS — Q667 Congenital pes cavus, unspecified foot: Secondary | ICD-10-CM

## 2021-04-22 NOTE — Progress Notes (Signed)
Office Visit Note   Patient: Caitlin Hood           Date of Birth: 1951/09/23           MRN: [id number] Visit Date: 04/22/2021 Requested by: Marden Noble, MD [address] [place],  [place] [address] PCP: Marden Noble, MD  Subjective: CC: Custom orthotic fitting  HPI: 70 year old female with past medical history of cavus feet, bunion deformity, metatarsal pain who is presenting to clinic for custom orthotic fitting.  Patient states that her previous foot concerns has been very well controlled with her orthotics, and she was hoping to get a new pair today.  She has noticed that over the past year her left big toe has been significantly more painful and stiff.  She tried to buy shoes with a stiffer sole, and feels that this is significantly improved her symptoms.  She is an avid hiker, and puts many miles on her orthotics.  Overall, she is very happy with how her previous orthotics have helped her to remain very active.                ROS:   All other systems were reviewed and are negative.  Objective: Vital Signs: BP 114/62   Ht 5\' 7"  (1.702 m)   Wt 135 lb (61.2 kg)   BMI 21.14 kg/m  No flowsheet data found.   No flowsheet data found.  Physical Exam:  General:  Alert and oriented, in no acute distress. Pulm:  Breathing unlabored. Psy:  Normal mood, congruent affect. Skin: Bilateral feet without bruises, rashes, or erythema. Overlying skin intact.  Foot exam:  Flexible cavus feet bilaterally.  There is a bunion deformity bilaterally, though it is much more significant on the left. Left foot with hallux rigidus, and pain upon passive extension. Right foot with mild hallux rigidus, though it is nonpainful. Bilateral feet with Morton's toe. No pain with palpation of metatarsal heads. Slight transverse arch collapse upon standing.  There is also a collapse with her longitudinal arch, though this corrects with toe standing. Sensation intact throughout bilateral  feet.  Imaging: No results found.  Assessment & Plan: 70 year old female presenting to clinic today for custom orthotic fitting, due to history of bunion deformity, pes cavus, transverse arch collapse, now with hallux rigidus bilaterally, left much more than right. -Fit for custom orthotics as described below.  Patient stated that these were comfortable.  Neutral gait with orthotic wear. -Given hallux rigidus, we discussed the possibility of a steel shank in her shoe, though this does not seem necessary in her more rigid tennis shoes or her rigid hiking boots.   -Discussed Medi-orthotics with built in hallux shake.  Patient felt that these were comfortable, and will consider them in the future.  She may return to clinic to purchase at any time. -Patient no further questions or concerns today.     Procedures: Patient was fitted for a : standard, cushioned, semi-rigid orthotic. The orthotic was heated and afterward the patient stood on the orthotic blank positioned on the orthotic stand. The patient was positioned in subtalar neutral position and 10 degrees of ankle dorsiflexion in a weight bearing stance. After completion of molding, a stable base was applied to the orthotic blank. The blank was ground to a stable position for weight bearing. Size: 8 Base: EVA Posting: First Ray Additional orthotic padding: None  Patient seen and evaluated with the sports medicine fellow.  I agree with the above plan of  care.  New custom orthotics were created as above.  Follow-up as needed.

## 2021-04-22 NOTE — Patient Instructions (Addendum)
Thank you for coming to see [place] today!  We hope you find your new orthotics very comfortable.  Should you notice any discomfort with walking please return to clinic to let us know.  We are happy to adjust her orthotics as needed.  If you are interested in the future, they are welcome to return to clinic to consider a steel shank support for your big toes, or additional orthotics with built-in first toe support.

## 2021-07-07 DIAGNOSIS — H43812 Vitreous degeneration, left eye: Secondary | ICD-10-CM | POA: Diagnosis not present

## 2021-07-07 DIAGNOSIS — H2513 Age-related nuclear cataract, bilateral: Secondary | ICD-10-CM | POA: Diagnosis not present

## 2021-07-07 DIAGNOSIS — H5203 Hypermetropia, bilateral: Secondary | ICD-10-CM | POA: Diagnosis not present

## 2021-08-03 DIAGNOSIS — G43909 Migraine, unspecified, not intractable, without status migrainosus: Secondary | ICD-10-CM | POA: Diagnosis not present

## 2021-08-03 DIAGNOSIS — Z79890 Hormone replacement therapy (postmenopausal): Secondary | ICD-10-CM | POA: Diagnosis not present

## 2021-08-03 DIAGNOSIS — Z0000 Encounter for general adult medical examination without abnormal findings: Secondary | ICD-10-CM | POA: Diagnosis not present

## 2021-08-03 DIAGNOSIS — Z800 Family history of malignant neoplasm of digestive organs: Secondary | ICD-10-CM | POA: Diagnosis not present

## 2021-08-03 DIAGNOSIS — Z79899 Other long term (current) drug therapy: Secondary | ICD-10-CM | POA: Diagnosis not present

## 2021-08-03 DIAGNOSIS — M25551 Pain in right hip: Secondary | ICD-10-CM | POA: Diagnosis not present

## 2021-08-03 DIAGNOSIS — E785 Hyperlipidemia, unspecified: Secondary | ICD-10-CM | POA: Diagnosis not present

## 2021-08-03 DIAGNOSIS — K5730 Diverticulosis of large intestine without perforation or abscess without bleeding: Secondary | ICD-10-CM | POA: Diagnosis not present

## 2021-08-03 DIAGNOSIS — R000 Tachycardia, unspecified: Secondary | ICD-10-CM | POA: Diagnosis not present

## 2021-08-03 DIAGNOSIS — E559 Vitamin D deficiency, unspecified: Secondary | ICD-10-CM | POA: Diagnosis not present

## 2021-08-03 DIAGNOSIS — Z1389 Encounter for screening for other disorder: Secondary | ICD-10-CM | POA: Diagnosis not present

## 2021-08-11 DIAGNOSIS — Z1231 Encounter for screening mammogram for malignant neoplasm of breast: Secondary | ICD-10-CM | POA: Diagnosis not present

## 2021-09-22 DIAGNOSIS — Z79890 Hormone replacement therapy (postmenopausal): Secondary | ICD-10-CM | POA: Diagnosis not present

## 2021-09-22 DIAGNOSIS — E785 Hyperlipidemia, unspecified: Secondary | ICD-10-CM | POA: Diagnosis not present

## 2021-09-22 DIAGNOSIS — Z809 Family history of malignant neoplasm, unspecified: Secondary | ICD-10-CM | POA: Diagnosis not present

## 2021-09-22 DIAGNOSIS — Z780 Asymptomatic menopausal state: Secondary | ICD-10-CM | POA: Diagnosis not present

## 2021-10-19 DIAGNOSIS — E7800 Pure hypercholesterolemia, unspecified: Secondary | ICD-10-CM | POA: Diagnosis not present

## 2022-01-25 DIAGNOSIS — Z811 Family history of alcohol abuse and dependence: Secondary | ICD-10-CM | POA: Diagnosis not present

## 2022-01-25 DIAGNOSIS — E785 Hyperlipidemia, unspecified: Secondary | ICD-10-CM | POA: Diagnosis not present

## 2022-01-25 DIAGNOSIS — Z79890 Hormone replacement therapy (postmenopausal): Secondary | ICD-10-CM | POA: Diagnosis not present

## 2022-01-25 DIAGNOSIS — Z85828 Personal history of other malignant neoplasm of skin: Secondary | ICD-10-CM | POA: Diagnosis not present

## 2022-01-25 DIAGNOSIS — Z780 Asymptomatic menopausal state: Secondary | ICD-10-CM | POA: Diagnosis not present

## 2022-01-25 DIAGNOSIS — Z818 Family history of other mental and behavioral disorders: Secondary | ICD-10-CM | POA: Diagnosis not present

## 2022-01-25 DIAGNOSIS — Z809 Family history of malignant neoplasm, unspecified: Secondary | ICD-10-CM | POA: Diagnosis not present

## 2022-01-25 DIAGNOSIS — Z87891 Personal history of nicotine dependence: Secondary | ICD-10-CM | POA: Diagnosis not present

## 2022-01-25 DIAGNOSIS — Z7722 Contact with and (suspected) exposure to environmental tobacco smoke (acute) (chronic): Secondary | ICD-10-CM | POA: Diagnosis not present

## 2022-02-01 DIAGNOSIS — D1801 Hemangioma of skin and subcutaneous tissue: Secondary | ICD-10-CM | POA: Diagnosis not present

## 2022-02-01 DIAGNOSIS — L570 Actinic keratosis: Secondary | ICD-10-CM | POA: Diagnosis not present

## 2022-02-01 DIAGNOSIS — D2261 Melanocytic nevi of right upper limb, including shoulder: Secondary | ICD-10-CM | POA: Diagnosis not present

## 2022-02-01 DIAGNOSIS — L304 Erythema intertrigo: Secondary | ICD-10-CM | POA: Diagnosis not present

## 2022-02-01 DIAGNOSIS — D225 Melanocytic nevi of trunk: Secondary | ICD-10-CM | POA: Diagnosis not present

## 2022-02-01 DIAGNOSIS — Z85828 Personal history of other malignant neoplasm of skin: Secondary | ICD-10-CM | POA: Diagnosis not present

## 2022-02-01 DIAGNOSIS — L821 Other seborrheic keratosis: Secondary | ICD-10-CM | POA: Diagnosis not present

## 2022-07-18 DIAGNOSIS — H52203 Unspecified astigmatism, bilateral: Secondary | ICD-10-CM | POA: Diagnosis not present

## 2022-07-18 DIAGNOSIS — H25813 Combined forms of age-related cataract, bilateral: Secondary | ICD-10-CM | POA: Diagnosis not present

## 2022-07-18 DIAGNOSIS — H5203 Hypermetropia, bilateral: Secondary | ICD-10-CM | POA: Diagnosis not present

## 2022-08-09 DIAGNOSIS — Z0000 Encounter for general adult medical examination without abnormal findings: Secondary | ICD-10-CM | POA: Diagnosis not present

## 2022-08-09 DIAGNOSIS — D126 Benign neoplasm of colon, unspecified: Secondary | ICD-10-CM | POA: Diagnosis not present

## 2022-08-09 DIAGNOSIS — G43909 Migraine, unspecified, not intractable, without status migrainosus: Secondary | ICD-10-CM | POA: Diagnosis not present

## 2022-08-09 DIAGNOSIS — M25551 Pain in right hip: Secondary | ICD-10-CM | POA: Diagnosis not present

## 2022-08-09 DIAGNOSIS — Z1331 Encounter for screening for depression: Secondary | ICD-10-CM | POA: Diagnosis not present

## 2022-08-09 DIAGNOSIS — Z800 Family history of malignant neoplasm of digestive organs: Secondary | ICD-10-CM | POA: Diagnosis not present

## 2022-08-09 DIAGNOSIS — R000 Tachycardia, unspecified: Secondary | ICD-10-CM | POA: Diagnosis not present

## 2022-08-09 DIAGNOSIS — K5730 Diverticulosis of large intestine without perforation or abscess without bleeding: Secondary | ICD-10-CM | POA: Diagnosis not present

## 2022-08-09 DIAGNOSIS — Z79899 Other long term (current) drug therapy: Secondary | ICD-10-CM | POA: Diagnosis not present

## 2022-08-09 DIAGNOSIS — E785 Hyperlipidemia, unspecified: Secondary | ICD-10-CM | POA: Diagnosis not present

## 2022-08-09 DIAGNOSIS — E559 Vitamin D deficiency, unspecified: Secondary | ICD-10-CM | POA: Diagnosis not present

## 2022-08-09 DIAGNOSIS — Z79890 Hormone replacement therapy (postmenopausal): Secondary | ICD-10-CM | POA: Diagnosis not present

## 2022-08-16 DIAGNOSIS — Z1231 Encounter for screening mammogram for malignant neoplasm of breast: Secondary | ICD-10-CM | POA: Diagnosis not present

## 2022-08-19 DIAGNOSIS — R319 Hematuria, unspecified: Secondary | ICD-10-CM | POA: Diagnosis not present

## 2022-10-20 ENCOUNTER — Ambulatory Visit: Payer: Medicare PPO | Admitting: Orthopaedic Surgery

## 2022-10-20 ENCOUNTER — Ambulatory Visit (INDEPENDENT_AMBULATORY_CARE_PROVIDER_SITE_OTHER): Payer: Medicare PPO

## 2022-10-20 DIAGNOSIS — M25551 Pain in right hip: Secondary | ICD-10-CM

## 2022-10-20 NOTE — Progress Notes (Signed)
The patient is someone I have not seen in about 3 and half years.  She is 71 years old and very active.  She has a remote history of a left hip replacement done in [place] many years ago.  That was done through a posterior approach.  When I saw her 3 and half years ago she was having some hip pain with the right hip but her x-rays showed a well-maintained hip joint.  She is still very active and thin.  She has a big trip coming up at some point.  She wanted [place] to look at her hip again today.  Her left hip should move smoothly and fluidly with no blocks or rotation no pain in the groin today at all.  She does do a lot of exercises and I think she actually overdoes her hip exercises.  An AP pelvis and a lateral of her right hip are obtained today and compared to previous films.  Her right hip joint space is still very well-maintained with no significant arthritic findings at all.  This is not changed in 3-1/2 years.  The left hip replacement still is well-maintained.  I would have her back off her exercises for her right hip and see what this does from a pain standpoint since she is doing so well.  Obviously if things worsen the next step would be obtaining an MRI of her right hip or even considering an intra-articular steroid injection if it gets bad enough for her given her normal plain film findings.  She agrees with this treatment plan.  All questions and concerns were answered and addressed.

## 2022-11-15 DIAGNOSIS — D123 Benign neoplasm of transverse colon: Secondary | ICD-10-CM | POA: Diagnosis not present

## 2022-11-15 DIAGNOSIS — Z09 Encounter for follow-up examination after completed treatment for conditions other than malignant neoplasm: Secondary | ICD-10-CM | POA: Diagnosis not present

## 2022-11-15 DIAGNOSIS — K648 Other hemorrhoids: Secondary | ICD-10-CM | POA: Diagnosis not present

## 2022-11-15 DIAGNOSIS — K5730 Diverticulosis of large intestine without perforation or abscess without bleeding: Secondary | ICD-10-CM | POA: Diagnosis not present

## 2022-11-15 DIAGNOSIS — Z86010 Personal history of colonic polyps: Secondary | ICD-10-CM | POA: Diagnosis not present

## 2022-11-15 DIAGNOSIS — D122 Benign neoplasm of ascending colon: Secondary | ICD-10-CM | POA: Diagnosis not present

## 2022-11-15 DIAGNOSIS — Z800 Family history of malignant neoplasm of digestive organs: Secondary | ICD-10-CM | POA: Diagnosis not present

## 2022-11-23 DIAGNOSIS — D123 Benign neoplasm of transverse colon: Secondary | ICD-10-CM | POA: Diagnosis not present

## 2022-11-30 DIAGNOSIS — Z86010 Personal history of colonic polyps: Secondary | ICD-10-CM | POA: Diagnosis not present

## 2022-11-30 DIAGNOSIS — D123 Benign neoplasm of transverse colon: Secondary | ICD-10-CM | POA: Diagnosis not present

## 2022-11-30 DIAGNOSIS — Z800 Family history of malignant neoplasm of digestive organs: Secondary | ICD-10-CM | POA: Diagnosis not present

## 2022-11-30 DIAGNOSIS — K5730 Diverticulosis of large intestine without perforation or abscess without bleeding: Secondary | ICD-10-CM | POA: Diagnosis not present

## 2022-11-30 DIAGNOSIS — Z09 Encounter for follow-up examination after completed treatment for conditions other than malignant neoplasm: Secondary | ICD-10-CM | POA: Diagnosis not present

## 2022-11-30 DIAGNOSIS — D122 Benign neoplasm of ascending colon: Secondary | ICD-10-CM | POA: Diagnosis not present

## 2022-11-30 DIAGNOSIS — K648 Other hemorrhoids: Secondary | ICD-10-CM | POA: Diagnosis not present

## 2023-02-09 ENCOUNTER — Ambulatory Visit: Payer: Medicare Other | Admitting: Sports Medicine

## 2023-02-09 VITALS — BP 90/54 | Ht 67.0 in | Wt 128.0 lb

## 2023-02-09 DIAGNOSIS — M2021 Hallux rigidus, right foot: Secondary | ICD-10-CM | POA: Diagnosis not present

## 2023-02-09 DIAGNOSIS — M2022 Hallux rigidus, left foot: Secondary | ICD-10-CM

## 2023-02-09 NOTE — Progress Notes (Signed)
  Elah Avellino - 72 y.o. female MRN [id number]  Date of birth: 1951/11/12    CHIEF COMPLAINT:   Hallux rigidus    SUBJECTIVE:   HPI:  Pleasant 72 year old female comes to clinic for follow-up of bilateral hallux rigidus.  She has been dealing with foot issues for a number of years now.  She has been wearing orthotics from this clinic for years.  She comes to clinic intermittently to get orthotics replaced.  Her last ones were made in 2022.  She has been pleased with these orthotics.  ROS:     See HPI  PERTINENT  PMH / PSH FH / / SH:  Past Medical, Surgical, Social, and Family History Reviewed & Updated in the EMR.  Pertinent findings include:  Previous left hip replacement  OBJECTIVE: BP (!) 90/54   Ht 5\' 7"  (1.702 m)   Wt 128 lb (58.1 kg)   BMI 20.05 kg/m   Physical Exam:  Vital signs are reviewed.  GEN: Alert and oriented, NAD Pulm: Breathing unlabored PSY: normal mood, congruent affect  MSK: Feet exam -flexible cavus feet bilaterally.  There is a bunion deformity bilaterally that is more significant on the left.  Left foot hallux rigidus with only a few degrees of flexion and extension of the great toe.  The right great toe also has hallux rigidus but has little bit more mobility compared to the left.  Bilateral maintenance of the longitudinal arch but bilateral forefoot collapse of the transverse arch.  The hindfoot and midfoot are well aligned with no calcaneal valgus.  ASSESSMENT & PLAN:  1.  Bilateral hallux rigidus  - Long-time patient here for repeat custom orthotic fitting, due to history of bilateral bunions, transverse arch collapse, and hallux rigidus bilaterally.  Will be fitted for custom orthotics as described below.  Recommended she work on some mobility exercises with the right great toe including flexion, extension, valgus and varus stretching, and longitudinal traction on the toe to increase its range of motion.  No need to do that on the left as that is  really lost because at this point.  She can follow-up as needed.  Patient was fitted for a : standard, cushioned, semi-rigid orthotic. The orthotic was heated and afterward the patient stood on the orthotic blank positioned on the orthotic stand. The patient was positioned in subtalar neutral position and 10 degrees of ankle dorsiflexion in a weight bearing stance. After completion of molding, a stable base was applied to the orthotic blank. The blank was ground to a stable position for weight bearing. Size: 8 Base: blue EVA Posting: bilateral first ray  Additional orthotic padding: none  Gait was neutral with orthotics in place. Patient found them to be comfortable. Follow-up as needed.   Dortha Kern, MD PGY-4, Sports Medicine Fellow [place]  I observed and examined the patient with the [facility] resident and agree with assessment and plan.  Note reviewed and modified by me. Ila Mcgill, MD

## 2024-05-13 ENCOUNTER — Encounter: Payer: Self-pay | Admitting: Family Medicine

## 2024-05-13 ENCOUNTER — Ambulatory Visit (INDEPENDENT_AMBULATORY_CARE_PROVIDER_SITE_OTHER): Admitting: Family Medicine

## 2024-05-13 VITALS — BP 105/59 | Ht 67.5 in | Wt 125.0 lb

## 2024-05-13 DIAGNOSIS — M2021 Hallux rigidus, right foot: Secondary | ICD-10-CM

## 2024-05-13 DIAGNOSIS — M21611 Bunion of right foot: Secondary | ICD-10-CM

## 2024-05-13 DIAGNOSIS — M2022 Hallux rigidus, left foot: Secondary | ICD-10-CM

## 2024-05-13 DIAGNOSIS — M21612 Bunion of left foot: Secondary | ICD-10-CM | POA: Diagnosis not present

## 2024-05-13 NOTE — Patient Instructions (Signed)
 Thank you for coming to see me today. It was a pleasure.   We made you new orthotics. Let us know if we need to add any extra cushioning or make changes.  If you have any questions or concerns, please do not hesitate to call the office at [phone].

## 2024-05-13 NOTE — Progress Notes (Signed)
 DATE OF VISIT: 05/13/2024    Caitlin Hood DOB: 03-05-1951 MRN: [id number]  CC:  custom orthotics  History of present Illness: Caitlin Hood is a 73 y.o. female who presents for a follow-up visit for custom orthotics Has received multiple sets of orthotics from us  in the past - last 02/09/23 Has known hx of b/l hallux rigidus Recently bought new pair shoes and would like to have updated orthotics with shoes as they are little bit bigger than her other shoes  Medications:  Outpatient Encounter Medications as of 05/13/2024  Medication Sig   estradiol (CLIMARA - DOSED IN MG/24 HR) 0.025 mg/24hr patch Place onto the skin.   FLUVIRIN SUSP ADM 0.5ML IM UTD   medroxyPROGESTERone (PROVERA) 2.5 MG tablet TK 1 T PO ONCE A DAY   simvastatin (ZOCOR) 20 MG tablet Take 20 mg by mouth daily.   No facility-administered encounter medications on file as of 05/13/2024.    Allergies: is allergic to sulfamethoxazole.  Physical Examination: Vitals: BP (!) 105/59   Ht 5' 7.5" (1.715 m)   Wt 125 lb (56.7 kg)   BMI 19.29 kg/m  GENERAL:  Caitlin Hood is a 73 y.o. female appearing their stated age, alert and oriented x 3, in no apparent distress.  SKIN: no rashes or lesions, skin clean, dry, intact MSK: Foot/ankle: Bilateral feet with flexible cavus feet.  Prominent bunion bilaterally, left greater than right.  Hallux rigidus bilaterally, left greater than right.  Flattening of transverse arch bilaterally, left greater than right.  Hindfoot and midfoot are well aligned, no calcaneal valgus.  No significant callus formation.  Walking without a limp. Neurovascular intact distally    Assessment & Plan  1. Hallux rigidus of both feet 2. Bilateral bunions Bilateral hallux rigidus and associated bunions and transverse arch collapse.  Has done quite well with previous orthotics and would like updated sent today  Plan: - Updated custom orthotics fabricated for her today as noted below.  These were  comfortable in her shoes after the visit - Did make repairs to her previous orthotic by going down the first ray post to make it more stable.  She can use these in alternative shoes. - She will follow-up with us  on an as-needed basis.  Patient expressed understanding  Patient was fitted for a : Fit 'n Run semi-rigid orthotic  The orthotic was heated, placed on the orthotic stand. The patient was positioned in subtalar neutral position and 10 degrees of ankle dorsiflexion in a weight bearing stance on the heated orthotic blank After completion of molding Blank: Fit 'n Run - size 9 Posting:  bilateral 1st ray post with blue EVA foam Base: none - built in Pensions consultant were comfortable in office today and she had a more neutral gait and alignment.   Patient expressed understanding & agreement with above.  Encounter Diagnoses  Name Primary?   Hallux rigidus of both feet Yes   Bilateral bunions     No orders of the defined types were placed in this encounter.

## 2024-06-10 DIAGNOSIS — D225 Melanocytic nevi of trunk: Secondary | ICD-10-CM | POA: Diagnosis not present

## 2024-06-10 DIAGNOSIS — D1801 Hemangioma of skin and subcutaneous tissue: Secondary | ICD-10-CM | POA: Diagnosis not present

## 2024-06-10 DIAGNOSIS — L814 Other melanin hyperpigmentation: Secondary | ICD-10-CM | POA: Diagnosis not present

## 2024-06-10 DIAGNOSIS — Z85828 Personal history of other malignant neoplasm of skin: Secondary | ICD-10-CM | POA: Diagnosis not present

## 2024-06-10 DIAGNOSIS — D2261 Melanocytic nevi of right upper limb, including shoulder: Secondary | ICD-10-CM | POA: Diagnosis not present

## 2024-06-10 DIAGNOSIS — L821 Other seborrheic keratosis: Secondary | ICD-10-CM | POA: Diagnosis not present

## 2024-07-29 DIAGNOSIS — H25813 Combined forms of age-related cataract, bilateral: Secondary | ICD-10-CM | POA: Diagnosis not present

## 2024-07-29 DIAGNOSIS — H5203 Hypermetropia, bilateral: Secondary | ICD-10-CM | POA: Diagnosis not present

## 2024-07-29 DIAGNOSIS — H52203 Unspecified astigmatism, bilateral: Secondary | ICD-10-CM | POA: Diagnosis not present

## 2024-08-22 ENCOUNTER — Other Ambulatory Visit (HOSPITAL_BASED_OUTPATIENT_CLINIC_OR_DEPARTMENT_OTHER): Payer: Self-pay | Admitting: Internal Medicine

## 2024-08-22 ENCOUNTER — Other Ambulatory Visit: Payer: Self-pay | Admitting: Internal Medicine

## 2024-08-22 DIAGNOSIS — Z23 Encounter for immunization: Secondary | ICD-10-CM | POA: Diagnosis not present

## 2024-08-22 DIAGNOSIS — E785 Hyperlipidemia, unspecified: Secondary | ICD-10-CM | POA: Diagnosis not present

## 2024-08-22 DIAGNOSIS — Z800 Family history of malignant neoplasm of digestive organs: Secondary | ICD-10-CM | POA: Diagnosis not present

## 2024-08-22 DIAGNOSIS — M25551 Pain in right hip: Secondary | ICD-10-CM | POA: Diagnosis not present

## 2024-08-22 DIAGNOSIS — Z681 Body mass index (BMI) 19 or less, adult: Secondary | ICD-10-CM | POA: Diagnosis not present

## 2024-08-22 DIAGNOSIS — Z0000 Encounter for general adult medical examination without abnormal findings: Secondary | ICD-10-CM | POA: Diagnosis not present

## 2024-08-22 DIAGNOSIS — E559 Vitamin D deficiency, unspecified: Secondary | ICD-10-CM | POA: Diagnosis not present

## 2024-08-22 DIAGNOSIS — G43909 Migraine, unspecified, not intractable, without status migrainosus: Secondary | ICD-10-CM | POA: Diagnosis not present

## 2024-08-22 DIAGNOSIS — Z79890 Hormone replacement therapy (postmenopausal): Secondary | ICD-10-CM

## 2024-08-22 DIAGNOSIS — Z13820 Encounter for screening for osteoporosis: Secondary | ICD-10-CM | POA: Diagnosis not present

## 2024-08-22 DIAGNOSIS — K5730 Diverticulosis of large intestine without perforation or abscess without bleeding: Secondary | ICD-10-CM | POA: Diagnosis not present

## 2024-08-22 DIAGNOSIS — Z79899 Other long term (current) drug therapy: Secondary | ICD-10-CM | POA: Diagnosis not present

## 2024-08-23 DIAGNOSIS — Z1231 Encounter for screening mammogram for malignant neoplasm of breast: Secondary | ICD-10-CM | POA: Diagnosis not present

## 2024-08-27 ENCOUNTER — Ambulatory Visit
Admission: RE | Admit: 2024-08-27 | Discharge: 2024-08-27 | Disposition: A | Discharge status: Home / Self Care | Source: Ambulatory Visit | Attending: Internal Medicine | Admitting: Internal Medicine

## 2024-08-27 DIAGNOSIS — Z79890 Hormone replacement therapy (postmenopausal): Secondary | ICD-10-CM

## 2024-09-09 DIAGNOSIS — R928 Other abnormal and inconclusive findings on diagnostic imaging of breast: Secondary | ICD-10-CM | POA: Diagnosis not present

## 2024-09-10 ENCOUNTER — Ambulatory Visit (HOSPITAL_BASED_OUTPATIENT_CLINIC_OR_DEPARTMENT_OTHER)
Admission: RE | Admit: 2024-09-10 | Discharge: 2024-09-10 | Disposition: A | Discharge status: Home / Self Care | Payer: Self-pay | Source: Ambulatory Visit | Attending: Internal Medicine | Admitting: Internal Medicine

## 2024-09-10 DIAGNOSIS — E785 Hyperlipidemia, unspecified: Secondary | ICD-10-CM | POA: Insufficient documentation

## 2024-09-17 DIAGNOSIS — M8589 Other specified disorders of bone density and structure, multiple sites: Secondary | ICD-10-CM | POA: Diagnosis not present

## 2024-10-03 ENCOUNTER — Encounter (HOSPITAL_BASED_OUTPATIENT_CLINIC_OR_DEPARTMENT_OTHER): Payer: Self-pay | Admitting: Nurse Practitioner

## 2024-10-03 ENCOUNTER — Ambulatory Visit (HOSPITAL_BASED_OUTPATIENT_CLINIC_OR_DEPARTMENT_OTHER): Admitting: Nurse Practitioner

## 2024-10-03 VITALS — BP 104/70 | HR 69 | Ht 67.5 in | Wt 120.0 lb

## 2024-10-03 DIAGNOSIS — R931 Abnormal findings on diagnostic imaging of heart and coronary circulation: Secondary | ICD-10-CM | POA: Diagnosis not present

## 2024-10-03 DIAGNOSIS — Z7189 Other specified counseling: Secondary | ICD-10-CM

## 2024-10-03 DIAGNOSIS — E785 Hyperlipidemia, unspecified: Secondary | ICD-10-CM

## 2024-10-03 DIAGNOSIS — R011 Cardiac murmur, unspecified: Secondary | ICD-10-CM

## 2024-10-03 MED ORDER — METOPROLOL TARTRATE 25 MG PO TABS: 25.0000 mg | ORAL_TABLET | Freq: Once | ORAL | 0 refills | Status: DC

## 2024-10-03 NOTE — Patient Instructions (Signed)
 Medication Instructions:   Your physician recommends that you continue on your current medications as directed. Please refer to the Current Medication list given to you today.   *If you need a refill on your cardiac medications before your next appointment, please call your pharmacy*  Lab Work:  PLEASE GET A LPA DRAWN AT YOUR PCP OFFICE, FASTING AFTER MIDNIGHT IN [place].   If you have labs (blood work) drawn today and your tests are completely normal, you will receive your results only by: MyChart Message (if you have MyChart) OR A paper copy in the mail If you have any lab test that is abnormal or we need to change your treatment, we will call you to review the results.  Testing/Procedures:  Your physician has requested that you have an echocardiogram. Echocardiography is a painless test that uses sound waves to create images of your heart. It provides your doctor with information about the size and shape of your heart and how well your heart's chambers and valves are working. This procedure takes approximately one hour. There are no restrictions for this procedure. Please do NOT wear cologne, perfume or lotions (deodorant is allowed). Please arrive 15 minutes prior to your appointment time.    Your cardiac CT will be scheduled at one of the below locations:    [facility] [address]  [place], [place] [address] [phone]   If scheduled at the Heart and Vascular Tower at [facility] street, please enter the parking lot using the [address] entrance and use the FREE valet service at the patient drop-off area. Enter the building and check-in with registration on the main floor.  Please follow these instructions carefully (unless otherwise directed):  An IV will be required for this test and Nitroglycerin will be given.   On the Night Before the Test: Be sure to Drink plenty of water. Do not consume any caffeinated/decaffeinated beverages  or chocolate 12 hours prior to your test. Do not take any antihistamines 12 hours prior to your test.   On the Day of the Test: Drink plenty of water until 1 hour prior to the test. Do not eat any food 1 hour prior to test. You may take your regular medications prior to the test.  Take metoprolol (Lopressor) ONE (1) TABLET BY MOUTH ( 25 MG)  two hours prior to test. FEMALES- please wear underwire-free bra if available, avoid dresses & tight clothing      After the Test: Drink plenty of water. After receiving IV contrast, you may experience a mild flushed feeling. This is normal. On occasion, you may experience a mild rash up to 24 hours after the test. This is not dangerous. If this occurs, you can take Benadryl 25 mg, Zyrtec, Claritin, or Allegra and increase your fluid intake. (Patients taking Tikosyn should avoid Benadryl, and may take Zyrtec, Claritin, or Allegra) If you experience trouble breathing, this can be serious. If it is severe call 911 IMMEDIATELY. If it is mild, please call our office.  We will call to schedule your test 2-4 weeks out understanding that some insurance companies will need an authorization prior to the service being performed.   For more information and frequently asked questions, please visit our website : [email or web address]  For non-scheduling related questions, please contact the cardiac imaging nurse navigator should you have any questions/concerns: Cardiac Imaging Nurse Navigators Direct Office Dial: [phone]   For scheduling needs, including cancellations and rescheduling, please call [place], [phone].  Follow-Up: At [facility], you and your health needs are our priority.  As part of our continuing mission to provide you with exceptional heart care, our providers are all part of one team.  This team includes your primary Cardiologist (physician) and Advanced Practice Providers or APPs (Physician Assistants and Nurse  Practitioners) who all work together to provide you with the care you need, when you need it.  Your next appointment:   3 month(s)  Provider:   Rosaline Bane, NP    We recommend signing up for the patient portal called MyChart.  Sign up information is provided on this After Visit Summary.  MyChart is used to connect with patients for Virtual Visits (Telemedicine).  Patients are able to view lab/test results, encounter notes, upcoming appointments, etc.  Non-urgent messages can be sent to your provider as well.   To learn more about what you can do with MyChart, go to [email or web address].

## 2024-10-03 NOTE — Progress Notes (Unsigned)
  Cardiology Office Note:  .   Date:  10/03/2024 ID:  Caitlin Hood, DOB Nov 20, 1951, MRN [id number] PCP: Caitlin Hood BRAVO, PA [facility] Providers Cardiologist:  None { Click to update primary MD,subspecialty MD or APP then REFRESH:1}   Patient Profile: .      PMH Coronary artery calcification CT calcium score 09/10/24 CAC score 840 (95th percentile) LM 0, LAD 208, LCx 0, RCA 632 Hyperlipidemia       History of Present Illness: .   Caitlin Hood is a *** 73 y.o. female  who is here today for new patient consult for ***  Lipid panel 08/22/24 TC 273, trig 58, HDL 84, LDL-C 180  High cholesterol since birth of 2nd child at age 10 Total cholesterol was 400, started statin therapy at age 13 Rosuvastatin 20 since 08/26/24  Family history: Her family history includes Multiple myeloma in her mother; Valvular heart disease (age of onset: 70) in her mother.   Discussed the use of AI scribe software for clinical note transcription with the patient, who gave verbal consent to proceed.  ASCVD Risk Score: The ASCVD Risk score (Arnett DK, et al., 2019) failed to calculate for the following reasons:   Cannot find a previous HDL lab   Cannot find a previous total cholesterol lab  {MD Calc ASCVD Calculator :1}  Diet: Pritikin diet (partner is in cardiac rehab) Smoothie in the mornings, yogurt Reducing sugar and saturated fat  Activity: Swims 1 mile 6 days per week Yoga  No results found for: LIPOA    ROS: ***       Studies Reviewed: SABRA   EKG Interpretation Date/Time:  Thursday October 03 2024 10:46:32 EDT Ventricular Rate:  60 PR Interval:  136 QRS Duration:  88 QT Interval:  430 QTC Calculation: 430 R Axis:   83  Text Interpretation: Normal sinus rhythm Normal ECG No previous ECGs available Confirmed by Percy Browning [phone]) on 10/03/2024 10:59:28 AM      Risk Assessment/Calculations:             Physical Exam:   VS: BP 104/70 (BP Location: Left Arm,  Patient Position: Sitting, Cuff Size: Normal)   Pulse 69   Ht 5' 7.5 (1.715 m)   Wt 120 lb (54.4 kg)   SpO2 96%   BMI 18.52 kg/m   Wt Readings from Last 3 Encounters:  10/03/24 120 lb (54.4 kg)  05/13/24 125 lb (56.7 kg)  02/09/23 128 lb (58.1 kg)     GEN: Well nourished, well developed in no acute distress NECK: No JVD; No carotid bruits CARDIAC: ***RRR, no murmurs, rubs, gallops RESPIRATORY:  Clear to auscultation without rales, wheezing or rhonchi  ABDOMEN: Soft, non-tender, non-distended EXTREMITIES:  No edema; No deformity     ASSESSMENT AND PLAN: .     Plan/Goals:{ Click here to update goals :1}        {Are you ordering a CV Procedure (e.g. stress test, cath, DCCV, TEE, etc)?   Press F2        :[id number]}  Dispo: ***  Signed, Browning Percy, NP-C

## 2024-10-04 ENCOUNTER — Encounter (HOSPITAL_BASED_OUTPATIENT_CLINIC_OR_DEPARTMENT_OTHER): Payer: Self-pay | Admitting: Nurse Practitioner

## 2024-10-07 ENCOUNTER — Ambulatory Visit (HOSPITAL_COMMUNITY)
Admission: RE | Admit: 2024-10-07 | Discharge: 2024-10-07 | Disposition: A | Discharge status: Home / Self Care | Source: Ambulatory Visit | Attending: Nurse Practitioner | Admitting: Nurse Practitioner

## 2024-10-07 ENCOUNTER — Ambulatory Visit: Payer: Self-pay | Admitting: Nurse Practitioner

## 2024-10-07 DIAGNOSIS — Z136 Encounter for screening for cardiovascular disorders: Secondary | ICD-10-CM | POA: Diagnosis not present

## 2024-10-07 DIAGNOSIS — I2510 Atherosclerotic heart disease of native coronary artery without angina pectoris: Secondary | ICD-10-CM | POA: Insufficient documentation

## 2024-10-07 DIAGNOSIS — R011 Cardiac murmur, unspecified: Secondary | ICD-10-CM | POA: Diagnosis not present

## 2024-10-07 DIAGNOSIS — R931 Abnormal findings on diagnostic imaging of heart and coronary circulation: Secondary | ICD-10-CM

## 2024-10-07 MED ORDER — NITROGLYCERIN 0.4 MG SL SUBL
0.8000 mg | SUBLINGUAL_TABLET | Freq: Once | SUBLINGUAL | Status: AC
  Administered 2024-10-07: 0.8 mg via SUBLINGUAL

## 2024-10-07 MED ORDER — IOHEXOL 350 MG/ML SOLN
100.0000 mL | Freq: Once | INTRAVENOUS | Status: AC | PRN
  Administered 2024-10-07: 100 mL via INTRAVENOUS

## 2024-10-09 ENCOUNTER — Telehealth (HOSPITAL_BASED_OUTPATIENT_CLINIC_OR_DEPARTMENT_OTHER): Payer: Self-pay | Admitting: *Deleted

## 2024-10-09 NOTE — Telephone Encounter (Signed)
 S/w pt due to echo being dbl booked. Moved pt's echo time to 11:30 am 10/15/dg/ Per Marty, echo tech ok to use this time.

## 2024-10-10 ENCOUNTER — Ambulatory Visit (INDEPENDENT_AMBULATORY_CARE_PROVIDER_SITE_OTHER)

## 2024-10-10 DIAGNOSIS — R011 Cardiac murmur, unspecified: Secondary | ICD-10-CM

## 2024-10-10 DIAGNOSIS — R931 Abnormal findings on diagnostic imaging of heart and coronary circulation: Secondary | ICD-10-CM | POA: Diagnosis not present

## 2024-10-10 LAB — ECHOCARDIOGRAM COMPLETE
Area-P 1/2: 4.21 cm2
P 1/2 time: 573 ms
S' Lateral: 1.77 cm

## 2024-11-30 ENCOUNTER — Encounter (INDEPENDENT_AMBULATORY_CARE_PROVIDER_SITE_OTHER): Payer: Self-pay

## 2025-01-07 NOTE — Progress Notes (Signed)
 " Cardiology Office Note:  .   Date:  01/13/2025 ID:  Caitlin Hood, DOB 1951-11-14, MRN [id number] PCP: Cleotilde Ruperto BRAVO, PA [facility] Providers Cardiologist:  None   Patient Profile: .      PMH Coronary artery calcification CT calcium score 09/10/24 CAC score 840 (95th percentile) LM 0, LAD 208, LCx 0, RCA 632 Hyperlipidemia Family history valve disease Mother had SAVR at age 73    Referred to cardiology and seen by me on 10/03/24 for evaluation of coronary artery calcification. History of high cholesterol, first identified at age 42, with a total cholesterol level of 300. Medication began at age 56 when total cholesterol increased to 400. Initially on simvastatin, she took it three times a week, increasing to daily after her husband's stroke. Switched to rosuvastatin on September 1st, with a recent LDL of 180. CT calcium score revealed calcium score of 840, placing her in the 95th percentile for women her age. She is very active, swimming a mile six days a week and practicing  yoga. She experiences occasional difficulty taking a deep breath during exercise and at night, which resolves with concentrated breathing efforts. She also experiences some fatigue, attributing it to age. Family history includes her mother having high cholesterol and undergoing aortic valve replacement at age 31. No siblings have heart stents or significant cardiac history. Her partner recently had CABG, so they are following a Pritikin approach, focusing on reducing fat and sugar. She consumes peanut butter, soups, tuna fish, and smoothies with protein powder, fruit, spinach, and yogurt. On aspirin 81 mg daily for the past three months. No chest pain, orthopnea, PND, edema, palpitations, presyncope or syncope. ASCVD risk score 8.2%. Murmur identified on exam that started in childhood. TTE 10/10/24 revealed normal LVEF 55-60%, no rwma, normal diastolic, normal RV function, mild aortic valve regurgitation.  Recommendation to obtain follow-up lipid and LP(a) at upcoming appointment with PCP.   Coronary CTA 10/07/24 revealed mild, non-obstructive two-vessel CAD involving < 25% stenosis in LAD and 25-49% stenosis in RCA. LP(a) is not elevated at 21.9 nmol/L. Lipid panel 11/28/24 with total cholesterol 153, HDL 64, triglycerides 44, and LDL-C 79.     History of Present Illness: .   Discussed the use of AI scribe software for clinical note transcription with the patient, who gave verbal consent to proceed.  History of Present Illness Caitlin Hood is a very pleasant 74 year old female who presents for follow-up of CAD. Her blood pressure has been as low as 80/46 mmHg without lightheadedness, presyncope or syncope. No fatigue.  We reviewed results of coronary CT and echo in detail. Her mother had aortic valve disease that required open heart valve surgery at age 63. She was hospitalized for 70 days but survived it and lived for a few years beyond that. LDL has improved from 180 mg/dL in September to 79 mg/dL, with total cholesterol 153 mg/dL and triglycerides 44 She takes rosuvastatin 20 mg daily, which she tolerates without concerning side effect. She had significant weight loss from an intestinal issue around Thanksgiving but has since regained some weight. She is concerned about diabetes given prior gestational diabetes, though her recent glucose was 95 mg/dL. No prior A1C testing to her awareness. She denies chest pain, shortness of breath, edema, fatigue, palpitations, orthopnea, and PND. She remains active with regular exercise and generally eats a healthy diet.   Family history: Her family history includes Multiple myeloma in her mother; Valvular heart disease (age of onset: 18)  in her mother.    No results found for: LIPOA    ROS: See HPI       Studies Reviewed: .          Risk Assessment/Calculations:             Physical Exam:   VS: BP (!) 80/46 (BP Location: Left Arm, Patient Position:  Sitting, Cuff Size: Normal)   Pulse 74   Ht 5' 7 (1.702 m)   Wt 119 lb 3.2 oz (54.1 kg)   SpO2 98%   BMI 18.67 kg/m   Wt Readings from Last 3 Encounters:  01/13/25 119 lb 3.2 oz (54.1 kg)  10/03/24 120 lb (54.4 kg)  05/13/24 125 lb (56.7 kg)     GEN: Well nourished, well developed in no acute distress NECK: No JVD; No carotid bruits CARDIAC: RRR, no murmurs, rubs, gallops RESPIRATORY:  Clear to auscultation without rales, wheezing or rhonchi  ABDOMEN: Soft, non-tender, non-distended EXTREMITIES:  No edema; No deformity     ASSESSMENT AND PLAN: .    Assessment & Plan Coronary artery disease Cardiac risk Significant coronary artery calcification with calcium score of 840 (95th percentile). Coronary CTA 10/07/2024 revealed mild, nonobstructive CAD involving the LAD and RCA.  We reviewed these findings in detail.  She does not have any further concerns.  She denies chest pain, dyspnea, or other symptoms concerning for angina.  No indication for further ischemic evaluation at this time. BP is well controlled. Significant improvement in LDL cholesterol as noted below.  --Continue rosuvastatin and aspirin - Continue heart healthy, mostly whole food diet avoiding processed foods, saturated fat, sugar, and other simple carbohydrates - Be as physically active as possible every day and aim for at least 150 minutes of moderate intensity exercise each week  Aortic valve regurgitation Soft systolic murmur at the apex. Family history significant for valve disease in her mother.  TTE 10/10/2024 revealed normal LVEF, mild AI, trivial MR. She is asymptomatic.  Reviewed red flag symptoms to report.  Advised likely repeat imaging in 3 to 5 years, sooner if clinically indicated. - Continue to monitor clinically for now  Hyperlipidemia LDL goal < 70 LP(a) not elevated 21.9 nmol/L.  Lipid panel completed 11/28/2024 with total cholesterol 153, HDL 64, LDL 79, and triglycerides 44.  Significant  improvement in LDL down from 180 on  08/22/2024 after switching from simvastatin to rosuvastatin 20 mg daily.  She is very pleased with this improvement and is not having any concerning side effects from rosuvastatin.  We had the conversation about additional potential lipid-lowering therapy.  Given recent weight loss and healthy lifestyle, we will continue with current therapy alone. Recommended that if she wanted to be more aggressive with lipid-lowering in the future, to notify us . - Continue rosuvastatin      Dispo: 1 year with me  Signed, Rosaline Bane, NP-C "

## 2025-01-09 ENCOUNTER — Other Ambulatory Visit: Payer: Self-pay | Admitting: Medical Genetics

## 2025-01-13 ENCOUNTER — Ambulatory Visit (HOSPITAL_BASED_OUTPATIENT_CLINIC_OR_DEPARTMENT_OTHER): Admitting: Nurse Practitioner

## 2025-01-13 ENCOUNTER — Encounter (HOSPITAL_BASED_OUTPATIENT_CLINIC_OR_DEPARTMENT_OTHER): Payer: Self-pay | Admitting: Nurse Practitioner

## 2025-01-13 VITALS — BP 80/46 | HR 74 | Ht 67.0 in | Wt 119.2 lb

## 2025-01-13 DIAGNOSIS — Z7189 Other specified counseling: Secondary | ICD-10-CM | POA: Diagnosis not present

## 2025-01-13 DIAGNOSIS — I2510 Atherosclerotic heart disease of native coronary artery without angina pectoris: Secondary | ICD-10-CM

## 2025-01-13 DIAGNOSIS — E785 Hyperlipidemia, unspecified: Secondary | ICD-10-CM

## 2025-01-13 DIAGNOSIS — I351 Nonrheumatic aortic (valve) insufficiency: Secondary | ICD-10-CM | POA: Diagnosis not present

## 2025-01-13 NOTE — Patient Instructions (Signed)
 Medication Instructions:   Your physician recommends that you continue on your current medications as directed. Please refer to the Current Medication list given to you today.   *If you need a refill on your cardiac medications before your next appointment, please call your pharmacy*  Lab Work:  None ordered.  If you have labs (blood work) drawn today and your tests are completely normal, you will receive your results only by: MyChart Message (if you have MyChart) OR A paper copy in the mail If you have any lab test that is abnormal or we need to change your treatment, we will call you to review the results.  Testing/Procedures:  None ordered.  Follow-Up: At [facility], you and your health needs are our priority.  As part of our continuing mission to provide you with exceptional heart care, our providers are all part of one team.  This team includes your primary Cardiologist (physician) and Advanced Practice Providers or APPs (Physician Assistants and Nurse Practitioners) who all work together to provide you with the care you need, when you need it.  Your next appointment:   1 year(s)  Provider:   Slater Duncan, NP    We recommend signing up for the patient portal called "MyChart".  Sign up information is provided on this After Visit Summary.  MyChart is used to connect with patients for Virtual Visits (Telemedicine).  Patients are able to view lab/test results, encounter notes, upcoming appointments, etc.  Non-urgent messages can be sent to your provider as well.   To learn more about what you can do with MyChart, go to [email or web address].   Other Instructions  Your physician wants you to follow-up in: 1 year.  You will receive a reminder letter in the mail two months in advance. If you don't receive a letter, please call our office to schedule the follow-up appointment.

## 2025-01-29 ENCOUNTER — Other Ambulatory Visit: Payer: Self-pay | Admitting: Medical Genetics

## 2025-01-29 DIAGNOSIS — Z006 Encounter for examination for normal comparison and control in clinical research program: Secondary | ICD-10-CM

## 2025-04-01 ENCOUNTER — Other Ambulatory Visit
